# Patient Record
Sex: Male | Born: 1945 | Race: Black or African American | Hispanic: No | Marital: Married | Smoking: Never smoker
Health system: Southern US, Community
[De-identification: ages and names within clinical notes are randomized; demographics above are authoritative.]

## PROBLEM LIST (undated history)

## (undated) DIAGNOSIS — J302 Other seasonal allergic rhinitis: Secondary | ICD-10-CM

## (undated) DIAGNOSIS — I10 Essential (primary) hypertension: Secondary | ICD-10-CM

## (undated) DIAGNOSIS — J45909 Unspecified asthma, uncomplicated: Secondary | ICD-10-CM

## (undated) HISTORY — DX: Essential (primary) hypertension: I10

## (undated) HISTORY — DX: Other seasonal allergic rhinitis: J30.2

## (undated) HISTORY — DX: Unspecified asthma, uncomplicated: J45.909

---

## 2004-03-02 ENCOUNTER — Emergency Department (HOSPITAL_COMMUNITY): Admission: AD | Admit: 2004-03-02 | Discharge: 2004-03-02 | Payer: Self-pay | Admitting: Family Medicine

## 2013-11-07 ENCOUNTER — Ambulatory Visit: Payer: BC Managed Care – PPO

## 2013-11-07 ENCOUNTER — Ambulatory Visit (INDEPENDENT_AMBULATORY_CARE_PROVIDER_SITE_OTHER): Payer: BC Managed Care – PPO | Admitting: Internal Medicine

## 2013-11-07 VITALS — BP 110/70 | HR 98 | Temp 98.6°F | Resp 18 | Ht 63.25 in | Wt 216.0 lb

## 2013-11-07 DIAGNOSIS — R05 Cough: Secondary | ICD-10-CM

## 2013-11-07 DIAGNOSIS — I10 Essential (primary) hypertension: Secondary | ICD-10-CM | POA: Insufficient documentation

## 2013-11-07 DIAGNOSIS — J45909 Unspecified asthma, uncomplicated: Secondary | ICD-10-CM

## 2013-11-07 DIAGNOSIS — J209 Acute bronchitis, unspecified: Secondary | ICD-10-CM

## 2013-11-07 DIAGNOSIS — N400 Enlarged prostate without lower urinary tract symptoms: Secondary | ICD-10-CM | POA: Insufficient documentation

## 2013-11-07 LAB — POCT CBC
Granulocyte percent: 70.8 %G (ref 37–80)
HCT, POC: 39.7 % — AB (ref 43.5–53.7)
Hemoglobin: 11.8 g/dL — AB (ref 14.1–18.1)
Lymph, poc: 2.1 (ref 0.6–3.4)
MCH, POC: 25.7 pg — AB (ref 27–31.2)
MCV: 86.4 fL (ref 80–97)
POC Granulocyte: 7.1 — AB (ref 2–6.9)
POC LYMPH PERCENT: 21.5 %L (ref 10–50)
POC MID %: 7.7 %M (ref 0–12)
RDW, POC: 15 %

## 2013-11-07 MED ORDER — HYDROCODONE-HOMATROPINE 5-1.5 MG/5ML PO SYRP: 5.0000 mL | ORAL_SOLUTION | Freq: Three times a day (TID) | ORAL | Status: DC | PRN

## 2013-11-07 MED ORDER — PREDNISONE 10 MG PO TABS: ORAL_TABLET | ORAL | Status: AC

## 2013-11-07 MED ORDER — BUDESONIDE-FORMOTEROL FUMARATE 160-4.5 MCG/ACT IN AERO: 2.0000 | INHALATION_SPRAY | Freq: Two times a day (BID) | RESPIRATORY_TRACT | Status: DC

## 2013-11-07 MED ORDER — AZITHROMYCIN 250 MG PO TABS: ORAL_TABLET | ORAL | Status: AC

## 2013-11-07 MED ORDER — ALBUTEROL SULFATE (2.5 MG/3ML) 0.083% IN NEBU: 2.5000 mg | INHALATION_SOLUTION | Freq: Once | RESPIRATORY_TRACT | Status: AC

## 2013-11-07 MED ORDER — MUCINEX DM MAXIMUM STRENGTH 60-1200 MG PO TB12: 1.0000 | ORAL_TABLET | Freq: Two times a day (BID) | ORAL | Status: DC

## 2013-11-07 MED ORDER — IPRATROPIUM BROMIDE 0.02 % IN SOLN: 0.5000 mg | Freq: Once | RESPIRATORY_TRACT | Status: DC

## 2013-11-07 MED ORDER — ALBUTEROL SULFATE (2.5 MG/3ML) 0.083% IN NEBU: 2.5000 mg | INHALATION_SOLUTION | Freq: Once | RESPIRATORY_TRACT | Status: DC

## 2013-11-07 NOTE — Progress Notes (Signed)
Subjective:    Patient ID: Bryan Fitzgerald, male    DOB: January 27, 1946, 67 y.o.   MRN: [id number]  HPI  Pt presents to clinic with 1 wk h/o cold symptoms that have continued to progress - he is having trouble sleeping at night due to his cough - he cannot get a full breath esp at night due to the cough.  He is coughing up brown/yellow sputum.  He has h/o asthma and is using his inhaler and still getting some relief.  He last used his inhaler last pm.  He has some nasal congestion and same colored rhinorrhea.  He has been using tylenol for some of his symptoms.    Review of Systems  Constitutional: Negative for fever and chills.  HENT: Positive for congestion, postnasal drip and rhinorrhea. Negative for ear pain and sore throat.   Respiratory: Positive for cough and shortness of breath. Negative for wheezing.   Musculoskeletal: Positive for myalgias.       Objective:   Physical Exam  Vitals reviewed. Constitutional: He is oriented to person, place, and time. He appears well-developed and well-nourished.  HENT:  Head: Normocephalic and atraumatic.  Right Ear: Hearing, tympanic membrane, external ear and ear canal normal.  Left Ear: Hearing, tympanic membrane, external ear and ear canal normal.  Nose: Mucosal edema (red) present.  Mouth/Throat: Uvula is midline, oropharynx is clear and moist and mucous membranes are normal.  Eyes: Conjunctivae are normal.  Cardiovascular: Normal rate, regular rhythm and normal heart sounds.   No murmur heard. Pulmonary/Chest: Effort normal. No respiratory distress. He has wheezes (inspiratory and expiratory).  Neurological: He is alert and oriented to person, place, and time.  Skin: Skin is warm and dry.  Psychiatric: He has a normal mood and affect. His behavior is normal. Judgment and thought content normal.   Pt was given an albuterol/atrovent neb -- he states that he feels slightly better - his lung exam showed some mild improvement with expiratory  wheezing and the inspiratory wheezing was almost resolved.  Pt was given a 2nd albuterol only neb and the expiratory wheezing improved a little more.  Pt was able to speak in full sentences and the cough had almost resolved.  [facility] reading (PRIMARY) by  Dr. Merla Riches.  Bilateral increased markings.  Results for orders placed in visit on 11/07/13  POCT CBC      Result Value Range   WBC 10.0  4.6 - 10.2 K/uL   Lymph, poc 2.1  0.6 - 3.4   POC LYMPH PERCENT 21.5  10 - 50 %L   MID (cbc) 0.8  0 - 0.9   POC MID % 7.7  0 - 12 %M   POC Granulocyte 7.1 (*) 2 - 6.9   Granulocyte percent 70.8  37 - 80 %G   RBC 4.59 (*) 4.69 - 6.13 M/uL   Hemoglobin 11.8 (*) 14.1 - 18.1 g/dL   HCT, POC 11.9 (*) 14.7 - 53.7 %   MCV 86.4  80 - 97 fL   MCH, POC 25.7 (*) 27 - 31.2 pg   MCHC 29.7 (*) 31.8 - 35.4 g/dL   RDW, POC 82.9     Platelet Count, POC 228  142 - 424 K/uL   MPV 8.9  0 - 99.8 fL       Assessment & Plan:  Asthma, chronic - Plan: ipratropium (ATROVENT) nebulizer solution 0.5 mg, albuterol (PROVENTIL) (2.5 MG/3ML) 0.083% nebulizer solution 2.5 mg, albuterol (PROVENTIL) (2.5 MG/3ML) 0.083% nebulizer  solution 2.5 mg, predniSONE (DELTASONE) 10 MG tablet, budesonide-formoterol (SYMBICORT) 160-4.5 MCG/ACT inhaler  Cough - Plan: DG Chest 2 View, POCT CBC, HYDROcodone-homatropine (HYCODAN) 5-1.5 MG/5ML syrup  Acute bronchitis - Plan: azithromycin (ZITHROMAX Z-PAK) 250 MG tablet, Dextromethorphan-Guaifenesin (MUCINEX DM MAXIMUM STRENGTH) 60-1200 MG TB12  Pt will restart his symbicort.  He will push fluids and if he is no better in 48h he will RTC - that will give him 2 days on prednisone and abx.  He will rest today and RTW tomorrow.  Benny Lennert PA-C 11/07/2013 3:06 PM  I have reviewed and agree with documentation. Robert P. Merla Riches, M.D.

## 2013-11-07 NOTE — Patient Instructions (Signed)
Mucinex when you get sick to help with the congestion to help prevent mucus from building up.  Tylenol and motrin to help with myalgias.

## 2013-11-10 ENCOUNTER — Telehealth: Payer: Self-pay

## 2013-11-10 NOTE — Telephone Encounter (Signed)
Pt states was seen by sarah on 11/18 and was given an out of work note for 11/18 & 11/19, pt is requesting a work note to cover 11/18 through 11/21 Please call pt to advise

## 2013-11-10 NOTE — Telephone Encounter (Signed)
Can we give him a work note to cover these dates? Please advise.

## 2013-11-11 ENCOUNTER — Encounter: Payer: Self-pay | Admitting: *Deleted

## 2013-11-11 NOTE — Telephone Encounter (Signed)
Note given to patient to cover dates

## 2014-10-12 ENCOUNTER — Other Ambulatory Visit: Payer: Self-pay | Admitting: Occupational Medicine

## 2014-10-12 ENCOUNTER — Ambulatory Visit: Payer: Self-pay

## 2014-10-12 DIAGNOSIS — R0602 Shortness of breath: Secondary | ICD-10-CM

## 2014-10-29 ENCOUNTER — Ambulatory Visit: Payer: Self-pay

## 2015-10-21 ENCOUNTER — Telehealth: Payer: Self-pay | Admitting: Internal Medicine

## 2015-10-21 NOTE — Telephone Encounter (Signed)
Per  MR- pt will need first available consult appt.  LMTCB for pt.

## 2015-10-24 NOTE — Telephone Encounter (Signed)
Left message for pt to call back  °

## 2015-10-25 NOTE — Telephone Encounter (Signed)
LMOMTCB x3 for pt 

## 2015-10-30 NOTE — Telephone Encounter (Signed)
lmtcb X4 for pt.  

## 2015-11-01 NOTE — Telephone Encounter (Signed)
lmtcb X5 for pt.  Letter sent to pt's home address on file.  Will close message per triage protocol.

## 2015-11-06 NOTE — Telephone Encounter (Signed)
Will forward to MR as FYI 

## 2016-01-01 ENCOUNTER — Telehealth: Payer: Self-pay | Admitting: Internal Medicine

## 2016-01-01 NOTE — Telephone Encounter (Signed)
Pt referred by Occupational Health. Pt was contacted numerous times to schedule appt but unable to reach pt. A letter was sent to pt's mailing address to contact us back to reschedule.   Will forward to MR as FYI.

## 2016-01-02 NOTE — Telephone Encounter (Signed)
Thanks, I have notified Dr Durene CalHunter the referring doc

## 2016-04-23 ENCOUNTER — Encounter: Payer: Self-pay | Admitting: Internal Medicine

## 2016-04-23 ENCOUNTER — Ambulatory Visit (INDEPENDENT_AMBULATORY_CARE_PROVIDER_SITE_OTHER): Payer: BLUE CROSS/BLUE SHIELD | Admitting: Internal Medicine

## 2016-04-23 VITALS — BP 158/80 | HR 107 | Ht 63.0 in | Wt 228.4 lb

## 2016-04-23 DIAGNOSIS — Z5739 Occupational exposure to other air contaminants: Secondary | ICD-10-CM | POA: Diagnosis not present

## 2016-04-23 DIAGNOSIS — Z8709 Personal history of other diseases of the respiratory system: Secondary | ICD-10-CM

## 2016-04-23 NOTE — Patient Instructions (Signed)
ICD-9-CM ICD-10-CM   1. History of chronic obstructive airway disease V12.69 Z87.09   2. Occupational exposure to air contaminants V15.89 Z57.39     Do full PFT Do FeNO testing  Followup - return to see me after PFT testing

## 2016-04-23 NOTE — Progress Notes (Signed)
Subjective:    Patient ID: Bryan Fitzgerald, male    DOB: September 27, 1946, 70 y.o.   MRN: [id number]  PCP Eino Farber, MD   HPI  IOV 04/23/2016  Chief Complaint  Patient presents with  . Pulmonary Consult    Pt states he is referred by Dr. Durene Cal. Pt denies significant SOB, cough, CP/tightness. Pt states he only wheezes when he has a cold or exposed to seasonal allergies.     [name]  70 year old gentleman who works at [name] where there is exposure to flavoring agents particularly diacetyl. He was originally referred end of September 2016 by Dr. Kathrine Haddock from occupational medicine. However it was hard to get hold of him and he now presents for evaluation, he is not fully sure why he is here today. He thinks this is because he wants to be released back to work. I do have Dr. Grayland Ormond Hunt's office note from 09/16/2015. The history is based on this note and talking to the patient was not exactly a good historian  He has worked at [name] for 17 years according to his history. 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 He has enjoyed working there. He does not want to quit. He tells me that he's never had any respiratory problem. He says that he uses a fit bed to monitor his steps and he easily crosses 10,000 steps daily. This is despite his significant obesity with a BMI of 40. He does not have any dyspnea on exertion. He does not have cough. He does not have wheeze. The only time he would wheezes when he has an occasional cold. However he is on Symbicort maintenance for the last 3 years according to him. This was prescribed by his primary care physician per history. He says is compliant with it. He is also on Singulair. For the same one time. He does not use pro-air because he does not require. He has a history of asthma but he does not know when this diagnosis was made on how it was made. He denies history of  exacerbations. Never smoked. No hospitalizations for asthma. No steroid intake for asthma.   In terms of occupational lung testing he says that he started doing PFTs/spirometry 3 years ago and he passed it at that point in time. He passed it again a year ago. This was at an outside [facility]. He thinks I am to do the next one now. However referral notes from Dr. Kathrine Haddock indicated that he has severe obstruction details of which are not fully known.  Exhaled nitric oxide testing today f is 25 ppb and normal. This is done while he is on Symbicort   Last chest x-Pedrosa 2015 personally visualized and is clear except for hyperinflation    Symptom score was done because he is a poor historian for documentation purposes and indeed his total score is 7 showing very minimal symptomatology..,   CAT sympotpm socre 04/23/2016   Never Cough -> Cough all the time 2  No phlegm in chest -> Chest is full of phlegm 2  No chest tightness -> Chest feels very tight 0  No dyspnea for 1 flight stairs/hill -> Very dyspneic for 1 flight of stairs 2  No limitations for ADL at home -> Very limited with ADL at home 0  Confident leaving home -> Not at all confident leaving  home 0  Sleep soundly -> Do not sleep soundly because of lung condition 1  Lots of Energy -> No energy at all 0  TOTAL Score (max 40)  7        has a past medical history of Asthma; Hypertension; and Seasonal allergies.   reports that he has never smoked. He has never used smokeless tobacco.  No past surgical history on file.  No Known Allergies   There is no immunization history on file for this patient.  Family History  Problem Relation Age of Onset  . Heart attack Mother      Current outpatient prescriptions:  .  albuterol (PROVENTIL HFA;VENTOLIN HFA) 108 (90 BASE) MCG/ACT inhaler, Inhale into the lungs every 6 (six) hours as needed for wheezing or shortness of breath., Disp: , Rfl:  .  aspirin  81 MG tablet, Take 81 mg by mouth daily., Disp: , Rfl:  .  budesonide-formoterol (SYMBICORT) 160-4.5 MCG/ACT inhaler, Inhale 2 puffs into the lungs 2 (two) times daily., Disp: 1 Inhaler, Rfl: 1 .  DOXAZOSIN MESYLATE PO, Take by mouth., Disp: , Rfl:  .  finasteride (PROSCAR) 5 MG tablet, Take 5 mg by mouth daily., Disp: , Rfl:  .  LOSARTAN POTASSIUM-HCTZ PO, Take by mouth., Disp: , Rfl:  .  montelukast (SINGULAIR) 10 MG tablet, Take 10 mg by mouth at bedtime., Disp: , Rfl:   Current facility-administered medications:  .  albuterol (PROVENTIL) (2.5 MG/3ML) 0.083% nebulizer solution 2.5 mg, 2.5 mg, Nebulization, Once, Morrell RiddleSarah L Weber, PA-C .  albuterol (PROVENTIL) (2.5 MG/3ML) 0.083% nebulizer solution 2.5 mg, 2.5 mg, Nebulization, Once, Morrell RiddleSarah L Weber, PA-C .  ipratropium (ATROVENT) nebulizer solution 0.5 mg, 0.5 mg, Nebulization, Once, Morrell RiddleSarah L Weber, PA-C     Review of Systems  Constitutional: Negative for fever and unexpected weight change.  HENT: Negative for congestion, dental problem, ear pain, nosebleeds, postnasal drip, rhinorrhea, sinus pressure, sneezing, sore throat and trouble swallowing.   Eyes: Negative for redness and itching.  Respiratory: Negative for cough, chest tightness, shortness of breath and wheezing.   Cardiovascular: Negative for palpitations and leg swelling.  Gastrointestinal: Negative for nausea and vomiting.  Genitourinary: Negative for dysuria.  Musculoskeletal: Negative for joint swelling.  Skin: Negative for rash.  Neurological: Negative for headaches.  Hematological: Does not bruise/bleed easily.  Psychiatric/Behavioral: Negative for dysphoric mood. The patient is not nervous/anxious.        Objective:   Physical Exam  Constitutional: He is oriented to person, place, and time. He appears well-developed and well-nourished. No distress.  Body mass index is 40.47 kg/(m^2).  His clothes smell of flavoring agents - classic [employer] employee smell     HENT:  Head: Normocephalic and atraumatic.  Right Ear: External ear normal.  Left Ear: External ear normal.  Mouth/Throat: Oropharynx is clear and moist. No oropharyngeal exudate.  Eyes: Conjunctivae and EOM are normal. Pupils are equal, round, and reactive to light. Right eye exhibits no discharge. Left eye exhibits no discharge. No scleral icterus.  Neck: Normal range of motion. Neck supple. No JVD present. No tracheal deviation present. No thyromegaly present.  Cardiovascular: Normal rate, regular rhythm and intact distal pulses.  Exam reveals no gallop and no friction rub.   No murmur heard. Pulmonary/Chest: Effort normal and breath sounds normal. No respiratory distress. He has no wheezes. He has no rales. He exhibits no tenderness.  Abdominal: Soft. Bowel sounds are normal. He exhibits no distension and no mass. There is no tenderness.  There is no rebound and no guarding.  Musculoskeletal: Normal range of motion. He exhibits no edema or tenderness.  Lymphadenopathy:    He has no cervical adenopathy.  Neurological: He is alert and oriented to person, place, and time. He has normal reflexes. No cranial nerve deficit. Coordination normal.  Skin: Skin is warm and dry. No rash noted. He is not diaphoretic. No erythema. No pallor.  Psychiatric: He has a normal mood and affect. His behavior is normal. Judgment and thought content normal.  Nursing note and vitals reviewed.   Filed Vitals:   04/23/16 1444  BP: 158/80  Pulse: 107  Height: 5\' 3"  (1.6 m)  Weight: 228 lb 6.4 oz (103.602 kg)  SpO2: 96%         Assessment & Plan:     ICD-9-CM ICD-10-CM   1. History of chronic obstructive airway disease V12.69 Z87.09 Pulmonary function test  2. Occupational exposure to air contaminants V15.89 Z57.39 Pulmonary function test    He has occupational exposure history of flavoring agents particularly diacetyl. His clothes smell of flavoring agents. He is minimally symptomatic and is feeling  well. There is a history of severe obstruction and I do not have these pulmonary function tests with me. The plan right now is to first establish that he has obstructive lung disease. If he does he will be subject to a CT scan of the chest we will also make some determination about looking at his old spirometry done outside   Dr. Kalman Shan, M.D., [facility].C.P Pulmonary and Critical Care Medicine Staff Physician [facility] Pager: [phone], If no answer or between  15:00h - 7:00h: call [phone]  04/23/2016 3:24 PM

## 2016-04-24 ENCOUNTER — Institutional Professional Consult (permissible substitution): Payer: Self-pay | Admitting: Internal Medicine

## 2016-06-12 ENCOUNTER — Ambulatory Visit (INDEPENDENT_AMBULATORY_CARE_PROVIDER_SITE_OTHER): Payer: BLUE CROSS/BLUE SHIELD | Admitting: Internal Medicine

## 2016-06-12 DIAGNOSIS — Z8709 Personal history of other diseases of the respiratory system: Secondary | ICD-10-CM

## 2016-06-12 DIAGNOSIS — J449 Chronic obstructive pulmonary disease, unspecified: Secondary | ICD-10-CM

## 2016-06-12 DIAGNOSIS — Z5739 Occupational exposure to other air contaminants: Secondary | ICD-10-CM

## 2016-06-12 LAB — PULMONARY FUNCTION TEST
DL/VA % pred: 114 %
DL/VA: 4.79 ml/min/mmHg/L
DLCO UNC: 21.34 ml/min/mmHg
DLCO cor % pred: 88 %
DLCO cor: 21.53 ml/min/mmHg
DLCO unc % pred: 87 %
FEF 25-75 PRE: 0.42 L/s
FEF 25-75 Post: 0.38 L/sec
FEF2575-%Change-Post: -11 %
FEF2575-%PRED-POST: 18 %
FEF2575-%Pred-Pre: 20 %
FEV1-%CHANGE-POST: -3 %
FEV1-%PRED-PRE: 49 %
FEV1-%Pred-Post: 48 %
FEV1-PRE: 1.12 L
FEV1-Post: 1.09 L
FEV1FVC-%Change-Post: 2 %
FEV1FVC-%Pred-Pre: 65 %
FEV6-%CHANGE-POST: -4 %
FEV6-%PRED-POST: 70 %
FEV6-%Pred-Pre: 73 %
FEV6-Post: 2 L
FEV6-Pre: 2.1 L
FEV6FVC-%CHANGE-POST: 1 %
FEV6FVC-%PRED-POST: 100 %
FEV6FVC-%PRED-PRE: 98 %
FVC-%CHANGE-POST: -5 %
FVC-%Pred-Post: 70 %
FVC-%Pred-Pre: 74 %
FVC-Post: 2.11 L
FVC-Pre: 2.23 L
POST FEV1/FVC RATIO: 51 %
PRE FEV6/FVC RATIO: 94 %
Post FEV6/FVC ratio: 95 %
Pre FEV1/FVC ratio: 50 %
RV % PRED: 166 %
RV: 3.46 L
TLC % pred: 99 %
TLC: 5.75 L

## 2016-06-12 NOTE — Progress Notes (Signed)
Patient ID: Roger ShelterWalter L Doublin, male   DOB: 1946/10/09, 70 y.o.   MRN: [id number]   PFT done today.Vivianne SpenceKatie Blessing Ozga,CMA

## 2016-06-26 ENCOUNTER — Ambulatory Visit (INDEPENDENT_AMBULATORY_CARE_PROVIDER_SITE_OTHER): Payer: BLUE CROSS/BLUE SHIELD | Admitting: Internal Medicine

## 2016-06-26 ENCOUNTER — Encounter: Payer: Self-pay | Admitting: Internal Medicine

## 2016-06-26 ENCOUNTER — Other Ambulatory Visit (INDEPENDENT_AMBULATORY_CARE_PROVIDER_SITE_OTHER): Payer: BLUE CROSS/BLUE SHIELD

## 2016-06-26 VITALS — BP 118/72 | HR 97 | Ht 64.0 in | Wt 230.0 lb

## 2016-06-26 DIAGNOSIS — J449 Chronic obstructive pulmonary disease, unspecified: Secondary | ICD-10-CM

## 2016-06-26 DIAGNOSIS — Z5739 Occupational exposure to other air contaminants: Secondary | ICD-10-CM | POA: Diagnosis not present

## 2016-06-26 LAB — CBC WITH DIFFERENTIAL/PLATELET
Basophils Absolute: 0 K/uL (ref 0.0–0.1)
Basophils Relative: 0.5 % (ref 0.0–3.0)
Eosinophils Absolute: 0.5 K/uL (ref 0.0–0.7)
Eosinophils Relative: 6 % — ABNORMAL HIGH (ref 0.0–5.0)
HCT: 35.6 % — ABNORMAL LOW (ref 39.0–52.0)
Hemoglobin: 11.3 g/dL — ABNORMAL LOW (ref 13.0–17.0)
Lymphocytes Relative: 12.5 % (ref 12.0–46.0)
Lymphs Abs: 1.1 K/uL (ref 0.7–4.0)
MCHC: 31.8 g/dL (ref 30.0–36.0)
MCV: 82.6 fl (ref 78.0–100.0)
Monocytes Absolute: 0.7 K/uL (ref 0.1–1.0)
Monocytes Relative: 7.6 % (ref 3.0–12.0)
Neutro Abs: 6.6 K/uL (ref 1.4–7.7)
Neutrophils Relative %: 73.4 % (ref 43.0–77.0)
Platelets: 235 K/uL (ref 150.0–400.0)
RBC: 4.31 Mil/uL (ref 4.22–5.81)
RDW: 16.3 % — ABNORMAL HIGH (ref 11.5–15.5)
WBC: 9 K/uL (ref 4.0–10.5)

## 2016-06-26 MED ORDER — FLUTICASONE FUROATE-VILANTEROL 200-25 MCG/INH IN AEPB: 1.0000 | INHALATION_SPRAY | Freq: Every day | RESPIRATORY_TRACT | Status: DC

## 2016-06-26 NOTE — Addendum Note (Signed)
Addended by: Maisie FusGREEN, Ehren Berisha M on: 06/26/2016 03:18 PM   Modules accepted: Orders

## 2016-06-26 NOTE — Patient Instructions (Addendum)
ICD-9-CM ICD-10-CM   1. Chronic obstructive pulmonary disease, unspecified COPD type (HCC) 496 J44.9   2. Occupational exposure to air contaminants V15.89 Z57.39     Possible that you have obstructive lung disease from exposure to chemicals at work  - ; I am unable to figure out other reason but will look for all possible causes  - Please sign release for results of old breathing tests from [employer] and Dr Petra KubaKilpatrick  - Need to rule out allergies and other causes of obstructive lung disease   - do cbc with diff, IgE, blood allergy panel  - - do HRCT chest  Start Breo high dose daily Use albuterol as needed Avoid lifting heavy objects and exposure to chemicals  Followup - Spirometry with BD testing (no lung vol, no dlco) at pft lab in 2 months - return to see me in 2 months or sooner if needd

## 2016-06-26 NOTE — Progress Notes (Signed)
Subjective:    Patient ID: Bryan Fitzgerald, male    DOB: August 12, 1946, 70 y.o.   MRN: [id number]  HPI  PCP Eino FarberKILPATRICK JR,GEORGE R, MD   HPI  IOV 04/23/2016  Chief Complaint  Patient presents with  . Pulmonary Consult    Pt states he is referred by Dr. Durene CalHunter. Pt denies significant SOB, cough, CP/tightness. Pt states he only wheezes when he has a cold or exposed to seasonal allergies.     Bryan Fitzgerald  70 year old gentleman who works at [name] where there is exposure to flavoring agents particularly diacetyl. He was originally referred end of September 2016 by Dr. Kathrine HaddockMary Ruth Hunt from occupational medicine. However it was hard to get hold of him and he now presents for evaluation, he is not fully sure why he is here today. He thinks this is because he wants to be released back to work. I do have Dr. Grayland OrmondMary Ruth Hunt's office note from 09/16/2015. The history is based on this note and talking to the patient was not exactly a good historian  He has worked at [name] for 17 years according to his history. 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 He has enjoyed working there. He does not want to quit. He tells me that he's never had any respiratory problem. He says that he uses a fit bit to monitor his steps and he easily crosses 10,000 steps daily. This is despite his significant obesity with a BMI of 40. He does not have any dyspnea on exertion. He does not have cough. He does not have wheeze. The only time he would wheezes when he has an occasional cold. However he is on Symbicort maintenance for the last 3 years according to him. This was prescribed by his primary care physician per history. He says is compliant with it. He is also on Singulair. For the same one time. He does not use pro-air because he does not require. He has a history of asthma but he does not know when this diagnosis was made on how it was made. He denies  history of exacerbations. Never smoked. No hospitalizations for asthma. No steroid intake for asthma.   In terms of occupational lung testing he says that he started doing PFTs/spirometry 3 years ago and he passed it at that point in time. He passed it again a year ago. This was [facility]. He thinks I am to do the next one now. However referral notes from Dr. Kathrine HaddockMary Ruth Hunt indicated that he has severe obstruction details of which are not fully known.  Exhaled nitric oxide testing today f is 25 ppb and normal. This is done while he is on Symbicort but on 06/26/2016 - he said he was not taking symbicort at this time   Last chest x-Hyser 2015 personally visualized and is clear except for hyperinflation    Symptom score was done because he is a poor historian for documentation purposes and indeed his total score is 7 showing very minimal symptomatology..,    OV 06/26/2016  Chief Complaint  Patient presents with  . Follow-up    Denies any current breathing issues. Review PFT   Here to discuss his test results. He tells me that last visit when we did the nitric oxide testing he was not taking his Symbicort. In fact has not been taking his Symbicort for many months.  He says it does not make any difference for him. He does use his Singulair and he is compliant with it. He again states that he was told by his employer that spirometry testing several years ago was normal and this postdated him starting work at [name]. He does not feel any dyspnea with exertion. He says that he is not exposed to chemicals directly. 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 He can lift 50 pounds without any problems. He does not use is albuterol for rescue he does not wake up in the middle of the night. Although and exam today he did have some wheezing. Pulmonary function test 06/12/2016 shows postbronchodilator FEV1 of 1.09 L/48% with a ratio 51  consistent with obstruction. There is no bronchodilator response. Total lung capacity 5.75 L/99% normal. DLCO is 21.53/88%. This fits in with severe obstruction.   CAT sympotpm socre 04/23/2016   Never Cough -> Cough all the time 2  No phlegm in chest -> Chest is full of phlegm 2  No chest tightness -> Chest feels very tight 0  No dyspnea for 1 flight stairs/hill -> Very dyspneic for 1 flight of stairs 2  No limitations for ADL at home -> Very limited with ADL at home 0  Confident leaving home -> Not at all confident leaving home 0  Sleep soundly -> Do not sleep soundly because of lung condition 1  Lots of Energy -> No energy at all 0  TOTAL Score (max 40)  7       has a past medical history of Asthma; Hypertension; and Seasonal allergies.   reports that he has never smoked. He has never used smokeless tobacco.  No past surgical history on file.  No Known Allergies   There is no immunization history on file for this patient.  Family History  Problem Relation Age of Onset  . Heart attack Mother      Current outpatient prescriptions:  .  albuterol (PROVENTIL HFA;VENTOLIN HFA) 108 (90 BASE) MCG/ACT inhaler, Inhale into the lungs every 6 (six) hours as needed for wheezing or shortness of breath., Disp: , Rfl:  .  aspirin 81 MG tablet, Take 81 mg by mouth daily., Disp: , Rfl:  .  DOXAZOSIN MESYLATE PO, Take by mouth., Disp: , Rfl:  .  finasteride (PROSCAR) 5 MG tablet, Take 5 mg by mouth daily., Disp: , Rfl:  .  LOSARTAN POTASSIUM-HCTZ PO, Take by mouth., Disp: , Rfl:  .  montelukast (SINGULAIR) 10 MG tablet, Take 10 mg by mouth at bedtime., Disp: , Rfl:   Current facility-administered medications:  .  albuterol (PROVENTIL) (2.5 MG/3ML) 0.083% nebulizer solution 2.5 mg, 2.5 mg, Nebulization, Once, Sarah L Weber, PA-C .  albuterol (PROVENTIL) (2.5 MG/3ML) 0.083% nebulizer solution 2.5 mg, 2.5 mg, Nebulization, Once, Morrell RiddleSarah L Weber, PA-C .  ipratropium (ATROVENT) nebulizer  solution 0.5 mg, 0.5 mg, Nebulization, Once, Morrell RiddleSarah L Weber, PA-C     Review of Systems     Objective:   Physical Exam  Filed Vitals:   06/26/16 1446  BP: 118/72  Pulse: 97  Height: 5\' 4"  (1.626 m)  Weight: 230 lb (104.327 kg)  SpO2: 95%   Obese male sitting comfortably. Focused exam shows wheezing bilaterally in the bases.      Assessment & Plan:     ICD-9-CM ICD-10-CM   1. Chronic obstructive pulmonary disease, unspecified COPD type (HCC) 496 J44.9   2. Occupational exposure to air contaminants V15.89  Z57.39     Possible that you have obstructive lung disease from exposure to chemicals at work  - ; I am unable to figure out other reason but will look for all possible causes  - Please sign release for results of old breathing tests from [employer] and Dr Petra Kuba  - Need to rule out allergies and other causes of obstructive lung disease   - do cbc with diff, IgE, blood allergy panel  - - do HRCT chest  Start Breo high dose daily Use albuterol as needed Avoid lifting heavy objects and exposure to chemicals Continue singulair  Followup - Spirometry with BD testing (no lung vol, no dlco) at pft lab in 2 months - to see response to breo - consider spiriva at [facility] if no response - return to see me in 2 months or sooner if needd     > 50% of this > 25 min visit spent in face to face counseling or coordination of care    Dr. Kalman Shan, M.D., [facility].C.P Pulmonary and Critical Care Medicine Staff Physician [facility] Pager: [phone], If no answer or between  15:00h - 7:00h: call [phone]  06/26/2016 3:10 PM

## 2016-06-29 LAB — RESPIRATORY ALLERGY PROFILE REGION II ~~LOC~~
ALTERNARIA ALTERNATA: 1.36 kU/L — AB
ASPERGILLUS FUMIGATUS M3: 0.71 kU/L — AB
Allergen, Cedar tree, t12: 0.52 kU/L — ABNORMAL HIGH
Allergen, Comm Silver Birch, t9: 0.57 kU/L — ABNORMAL HIGH
Allergen, Cottonwood, t14: 0.66 kU/L — ABNORMAL HIGH
Allergen, Mouse Urine Protein, e78: <0.1 kU/L
Allergen, Mulberry, t76: 0.3 kU/L — ABNORMAL HIGH
Allergen, Oak,t7: 0.58 kU/L — ABNORMAL HIGH
Bermuda Grass: 0.43 kU/L — ABNORMAL HIGH
Box Elder IgE: 0.9 kU/L — ABNORMAL HIGH
COMMON RAGWEED: 0.43 kU/L — AB
Cat Dander: 1.94 kU/L — ABNORMAL HIGH
Cladosporium Herbarum: 0.83 kU/L — ABNORMAL HIGH
Cockroach: <0.1 kU/L
D. FARINAE: 0.23 kU/L — AB
Dog Dander: 0.34 kU/L — ABNORMAL HIGH
Elm IgE: 0.72 kU/L — ABNORMAL HIGH
IgE (Immunoglobulin E), Serum: 377 kU/L — ABNORMAL HIGH (ref <115)
JOHNSON GRASS: 0.47 kU/L — AB
PECAN/HICKORY TREE IGE: 2 kU/L — AB
PENICILLIUM NOTATUM: 0.36 kU/L — AB
ROUGH PIGWEED IGE: 0.44 kU/L — AB
Sheep Sorrel IgE: 0.37 kU/L — ABNORMAL HIGH
TIMOTHY GRASS: 4.13 kU/L — AB

## 2016-07-02 ENCOUNTER — Telehealth: Payer: Self-pay | Admitting: Internal Medicine

## 2016-07-02 NOTE — Telephone Encounter (Signed)
Pls let him know a) ok I respec thim canceling ct; b) allergy test positive - will discuss at [facility]

## 2016-07-02 NOTE — Telephone Encounter (Signed)
Pt cancelled CT chest, stating he did not want to have it.   Forwarding to MR as FYI.

## 2016-07-03 ENCOUNTER — Inpatient Hospital Stay: Admission: RE | Admit: 2016-07-03 | Payer: Self-pay | Source: Ambulatory Visit

## 2016-07-03 NOTE — Telephone Encounter (Signed)
Called and lm with family to have pt return our call.

## 2016-07-06 NOTE — Telephone Encounter (Signed)
lmtcb x2 for pt's family.

## 2016-07-07 NOTE — Telephone Encounter (Signed)
Another message has been left with the pt's family.

## 2016-07-08 NOTE — Telephone Encounter (Signed)
We have attempted to contact the pt several times with no success or response from the pt or his family. Message will be closed per triage protocol.

## 2016-09-04 ENCOUNTER — Ambulatory Visit: Payer: Self-pay | Admitting: Internal Medicine

## 2016-10-19 ENCOUNTER — Telehealth: Payer: Self-pay | Admitting: Internal Medicine

## 2016-10-19 NOTE — Telephone Encounter (Signed)
lmtcb X1 for Melissa.  

## 2016-10-22 NOTE — Telephone Encounter (Signed)
lmomtcb x 2  

## 2016-10-27 NOTE — Telephone Encounter (Signed)
lmtcb X3 for Bryan Fitzgerald.  Will close encounter per triage protocol.

## 2017-05-20 ENCOUNTER — Encounter: Payer: Self-pay | Admitting: Podiatry

## 2017-05-20 ENCOUNTER — Ambulatory Visit: Payer: Self-pay | Admitting: Podiatry

## 2017-05-20 ENCOUNTER — Ambulatory Visit (INDEPENDENT_AMBULATORY_CARE_PROVIDER_SITE_OTHER): Payer: Managed Care, Other (non HMO) | Admitting: Podiatry

## 2017-05-20 VITALS — Ht 63.0 in | Wt 220.0 lb

## 2017-05-20 DIAGNOSIS — M2041 Other hammer toe(s) (acquired), right foot: Secondary | ICD-10-CM | POA: Diagnosis not present

## 2017-05-20 DIAGNOSIS — M21619 Bunion of unspecified foot: Secondary | ICD-10-CM | POA: Diagnosis not present

## 2017-05-20 DIAGNOSIS — M79672 Pain in left foot: Secondary | ICD-10-CM | POA: Diagnosis not present

## 2017-05-20 DIAGNOSIS — M79671 Pain in right foot: Secondary | ICD-10-CM | POA: Diagnosis not present

## 2017-05-20 DIAGNOSIS — B351 Tinea unguium: Secondary | ICD-10-CM | POA: Diagnosis not present

## 2017-05-20 DIAGNOSIS — M2042 Other hammer toe(s) (acquired), left foot: Secondary | ICD-10-CM

## 2017-05-20 NOTE — Progress Notes (Signed)
SUBJECTIVE: 71 y.o. year old male presents complaining of painful corns and calluses under the tip of toe and ball of feet. Also having problem with dry skin. Feet aches to stand on. He is still employed full time wearing steel toed shoes.  REVIEW OF SYSTEMS: A comprehensive review of systems was negative except for: Asthma, Hypertension and takes medication for Prostate.  OBJECTIVE: DERMATOLOGIC EXAMINATION: Nails: Thick dystrophic nails x 10. Thick fungal debri Digital corn distal end 2nd toe bilateral. Plantar callus under great toe bilateral.  VASCULAR EXAMINATION OF LOWER LIMBS: All pedal pulses are palpable with normal pulsation.  Capillary Filling times within 3 seconds in all digits.  No edema or erythema noted. Temperature gradient from tibial crest to dorsum of foot is within normal bilateral.  NEUROLOGIC EXAMINATION OF THE LOWER LIMBS: Achilles DTR is present and within normal. Monofilament (Semmes-Weinstein 10-gm) sensory testing positive 6 out of 6, bilateral. Vibratory sensations(128Hz  turning fork) intact at medial and lateral forefoot bilateral.  Sharp and Dull discriminatory sensations at the plantar ball of hallux is intact bilateral.   MUSCULOSKELETAL EXAMINATION: Positive for HAV with bunion right.  Flat medial longitudinal arch bilateral. Contracted 2nd digits bilateral.   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 Painful digital corns 2nd digits bilateral. Painful onychomycosis x 10, severe on both great toes.  PLAN: Reviewed clinical findings and available treatment options. All nails and skin lesions debrided. Reviewed benefit of custom orthotics and proper shoe gear. Return in 3 months or sooner if needed.

## 2017-05-20 NOTE — Patient Instructions (Addendum)
Seen for painful corns calluses and nails. All debrided. May benefit from Vitamin A cream for dry skin condition.  May benefit from custom orthotics. Return in 3 month.

## 2017-08-19 ENCOUNTER — Encounter: Payer: Self-pay | Admitting: Podiatry

## 2017-08-19 ENCOUNTER — Ambulatory Visit (INDEPENDENT_AMBULATORY_CARE_PROVIDER_SITE_OTHER): Payer: Managed Care, Other (non HMO) | Admitting: Podiatry

## 2017-08-19 DIAGNOSIS — M79672 Pain in left foot: Secondary | ICD-10-CM

## 2017-08-19 DIAGNOSIS — M79671 Pain in right foot: Secondary | ICD-10-CM

## 2017-08-19 DIAGNOSIS — B351 Tinea unguium: Secondary | ICD-10-CM

## 2017-08-19 NOTE — Patient Instructions (Signed)
Seen for hypertrophic nails. All nails debrided. May soak in Vinegar water 15 minutes for itch feet. Return in 3 months or as needed.

## 2017-08-19 NOTE — Progress Notes (Signed)
SUBJECTIVE: 71 y.o. year old male presents complaining of itch skin and thick toe nails. Also having problem with dry skin.  Feet aches to stand on. He is still employed full time wearing steel toed shoes.  OBJECTIVE: DERMATOLOGIC EXAMINATION: Thick dystrophic nails x 10. Thick fungal debri Digital corn distal end 2nd toe bilateral. Plantar callus under great toe bilateral.  VASCULAR EXAMINATION OF LOWER LIMBS: All pedal pulses are palpable with normal pulsation.  Capillary Filling times within 3 seconds in all digits.  No edema or erythema noted. Temperature gradient from tibial crest to dorsum of foot is within normal bilateral.  NEUROLOGIC EXAMINATION OF THE LOWER LIMBS: All pedal pulses are palpable. Sharp and Dull discriminatory sensations at the plantar ball of hallux is intact bilateral.   MUSCULOSKELETAL EXAMINATION: Positive for HAV with bunion right.  Flat medial longitudinal arch bilateral. Contracted 2nd digits bilateral.   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 Painful digital corns 2nd digits bilateral. Painful onychomycosis x 10, severe on both great toes.  PLAN: Reviewed clinical findings and available treatment options. All nails and skin lesions debrided. May soak in Vinegar water or Salsun blue shampoo and scrub off dry skin. Return in 3 months or sooner if needed.

## 2017-11-18 ENCOUNTER — Ambulatory Visit: Payer: Managed Care, Other (non HMO) | Admitting: Podiatry

## 2017-11-18 ENCOUNTER — Encounter: Payer: Self-pay | Admitting: Podiatry

## 2017-11-18 DIAGNOSIS — M2041 Other hammer toe(s) (acquired), right foot: Secondary | ICD-10-CM

## 2017-11-18 DIAGNOSIS — B351 Tinea unguium: Secondary | ICD-10-CM

## 2017-11-18 DIAGNOSIS — M2042 Other hammer toe(s) (acquired), left foot: Secondary | ICD-10-CM

## 2017-11-18 DIAGNOSIS — M79672 Pain in left foot: Secondary | ICD-10-CM | POA: Diagnosis not present

## 2017-11-18 DIAGNOSIS — M79671 Pain in right foot: Secondary | ICD-10-CM | POA: Diagnosis not present

## 2017-11-18 NOTE — Patient Instructions (Addendum)
Seen for hypertrophic nails and painful corns. All nails and painful lesions debrided. OTC orthotic dispensed. Return in 3 months or as needed.

## 2017-11-18 NOTE — Progress Notes (Signed)
Subjective: 71 y.o. year old male patient presents complaining of painful nails. 2nd toe left foot hurts and ball of both feet hurts to walk. He was recommended of custom orthotics. Stated that he cannot get custom made but will try over the counter products. 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  Objective: Dermatologic: Thick yellow deformed nails x 10.  Painful digital corn distal end 2nd toe left. Vascular: Pedal pulses are all palpable. Orthopedic: Hallux valgus with bunion deformity bilateral. Pes planus bilateral with pain on balls of both feet. Severe digital contracture with digital corn distal end 2nd L>R. Neurologic: All epicritic and tactile sensations grossly intact.  Assessment: Dystrophic mycotic nails x 10. Painful digital corn 2nd bilateral. Lesser metatarsalgia bilateral. Pes planus bilateral.  Treatment: All mycotic nails and painful corns debrided.  As per request OTC orthotics size 10 dispensed. Return in 3 months or as needed.

## 2018-02-15 ENCOUNTER — Ambulatory Visit: Payer: Managed Care, Other (non HMO) | Admitting: Podiatry

## 2018-03-01 ENCOUNTER — Ambulatory Visit: Payer: Managed Care, Other (non HMO) | Admitting: Podiatry

## 2018-03-01 DIAGNOSIS — M2041 Other hammer toe(s) (acquired), right foot: Secondary | ICD-10-CM | POA: Diagnosis not present

## 2018-03-01 DIAGNOSIS — M2042 Other hammer toe(s) (acquired), left foot: Secondary | ICD-10-CM

## 2018-03-01 DIAGNOSIS — B351 Tinea unguium: Secondary | ICD-10-CM | POA: Diagnosis not present

## 2018-03-01 DIAGNOSIS — M79671 Pain in right foot: Secondary | ICD-10-CM | POA: Diagnosis not present

## 2018-03-01 DIAGNOSIS — M79672 Pain in left foot: Secondary | ICD-10-CM

## 2018-03-01 NOTE — Patient Instructions (Signed)
Seen for hypertrophic nails. All nails debrided. Return in 3 months or as needed.  

## 2018-03-04 ENCOUNTER — Encounter: Payer: Self-pay | Admitting: Podiatry

## 2018-03-04 NOTE — Progress Notes (Signed)
Subjective: 72 y.o. year old male patient presents complaining of painful nails. Patient requests toe nails trimmed. Still working full time and on feet during the day.  Objective: Dermatologic: Thick yellow deformed nails x 10. Vascular: Pedal pulses are all palpable. Orthopedic: Hallux valgus with bunion bilateral. Pes planus bilateral with pain in ball of both feet. Neurologic: All epicritic and tactile sensations grossly intact.  Assessment: Dystrophic mycotic nails x 10. Pain in both feet. Lesser metatarsalgia.  Treatment: All mycotic nails debrided.  Return in 3 months or as needed.

## 2018-06-07 ENCOUNTER — Ambulatory Visit: Payer: Managed Care, Other (non HMO) | Admitting: Podiatry

## 2018-06-14 ENCOUNTER — Ambulatory Visit: Payer: Managed Care, Other (non HMO) | Admitting: Podiatry

## 2018-06-16 ENCOUNTER — Ambulatory Visit: Payer: Managed Care, Other (non HMO) | Admitting: Podiatry

## 2018-06-16 ENCOUNTER — Encounter: Payer: Self-pay | Admitting: Podiatry

## 2018-06-16 DIAGNOSIS — B351 Tinea unguium: Secondary | ICD-10-CM | POA: Diagnosis not present

## 2018-06-16 DIAGNOSIS — M79671 Pain in right foot: Secondary | ICD-10-CM | POA: Diagnosis not present

## 2018-06-16 DIAGNOSIS — L600 Ingrowing nail: Secondary | ICD-10-CM

## 2018-06-16 DIAGNOSIS — M79672 Pain in left foot: Secondary | ICD-10-CM | POA: Diagnosis not present

## 2018-06-16 NOTE — Patient Instructions (Signed)
Seen for hypertrophic nails. All nails debrided. Return in 3 months or as needed.  

## 2018-06-16 NOTE — Progress Notes (Signed)
Subjective: 72 y.o. year old male patient presents complaining of painful nails. Patient requests toe nails trimmed. Still working full time as a Hospital doctordriver.   Objective: Dermatologic: Thick yellow deformed nails x 10. Dystrophic ingrown nails on both big and 2nd toe nails. Vascular: Pedal pulses are all palpable. Orthopedic: Contracted lesser digits bilateral. Neurologic: All epicritic and tactile sensations grossly intact.  Assessment: Dystrophic mycotic nails x 10. Ingrown nails both feet. Painful toes bilateral.  Treatment: All mycotic nails debrided.  Return in 3 months or as needed.

## 2018-09-22 ENCOUNTER — Ambulatory Visit: Payer: Managed Care, Other (non HMO) | Admitting: Podiatry

## 2018-09-22 DIAGNOSIS — L600 Ingrowing nail: Secondary | ICD-10-CM

## 2018-09-22 DIAGNOSIS — B351 Tinea unguium: Secondary | ICD-10-CM

## 2018-09-22 DIAGNOSIS — M79671 Pain in right foot: Secondary | ICD-10-CM

## 2018-09-22 DIAGNOSIS — M79672 Pain in left foot: Secondary | ICD-10-CM

## 2018-09-22 NOTE — Patient Instructions (Signed)
Seen for hypertrophic nails. All nails debrided. Return as needed.  

## 2018-09-26 ENCOUNTER — Encounter: Payer: Self-pay | Admitting: Podiatry

## 2018-09-26 NOTE — Progress Notes (Signed)
Subjective: 72 y.o. year old male patient presents complaining of painful nails. Patient requests toe nails trimmed. Still working full time.  Objective: Dermatologic: Thick yellow deformed nails x 10. Painful ingrown nails on both great toes. Vascular: Pedal pulses are all palpable. Orthopedic: Contracted lesser digits bilateral. Neurologic: All epicritic and tactile sensations grossly intact.  Assessment: Dystrophic mycotic nails x 10.  Treatment: All mycotic nails, corns, calluses debrided.  Return in 3 months or as needed.

## 2018-11-24 ENCOUNTER — Ambulatory Visit: Payer: Managed Care, Other (non HMO) | Admitting: Podiatry

## 2019-01-12 ENCOUNTER — Ambulatory Visit: Payer: Managed Care, Other (non HMO) | Admitting: Podiatry

## 2019-01-25 ENCOUNTER — Ambulatory Visit: Payer: Managed Care, Other (non HMO)

## 2019-01-25 ENCOUNTER — Ambulatory Visit: Payer: Managed Care, Other (non HMO) | Admitting: Podiatrist

## 2019-01-25 VITALS — BP 177/87 | HR 84

## 2019-01-25 DIAGNOSIS — M21619 Bunion of unspecified foot: Secondary | ICD-10-CM

## 2019-01-25 DIAGNOSIS — M79672 Pain in left foot: Secondary | ICD-10-CM

## 2019-01-25 DIAGNOSIS — L84 Corns and callosities: Secondary | ICD-10-CM

## 2019-01-25 DIAGNOSIS — M79671 Pain in right foot: Secondary | ICD-10-CM

## 2019-01-25 DIAGNOSIS — B351 Tinea unguium: Secondary | ICD-10-CM

## 2019-01-25 DIAGNOSIS — M2041 Other hammer toe(s) (acquired), right foot: Secondary | ICD-10-CM

## 2019-01-25 DIAGNOSIS — M2042 Other hammer toe(s) (acquired), left foot: Secondary | ICD-10-CM

## 2019-01-25 DIAGNOSIS — M79676 Pain in unspecified toe(s): Secondary | ICD-10-CM

## 2019-01-25 DIAGNOSIS — M76822 Posterior tibial tendinitis, left leg: Secondary | ICD-10-CM

## 2019-01-27 ENCOUNTER — Encounter: Payer: Self-pay | Admitting: Podiatrist

## 2019-01-27 NOTE — Progress Notes (Signed)
  Chief Complaint  Patient presents with  . Nail Problem    Bilateral nail trim 1-5  . Callouses    Pt states callouses bilateral 2nd toe tip of digits. Pt states he wears steel toe shoes which may cause this  . Ankle Pain    Pt states occasional left medial ankle discomfort which is not severe     HPI: Patient is 73 y.o. male who presents today for preventive foot and nail care.  He was seen Dr. Salli Real but her office closed abruptly and [facility].  Relates he has occasional left medial ankle discomfort but this is normal and not extremely painful at today's visit   Review of Systems  DATA OBTAINED: from patient  GENERAL: Feels well no fevers, no fatigue, no changes in appetite SKIN: No itching, no rashes, no open wounds EYES: No eye pain,no redness, no discharge EARS: No earache,no ringing of ears, NOSE: No congestion, no drainage, no bleeding  MOUTH/THROAT: No mouth pain, No sore throat, No difficulty chewing or swallowing  RESPIRATORY: No cough, no wheezing, no SOB CARDIAC: No chest pain,no heart palpitations, GI: No abdominal pain, No Nausea, no vomiting, no diarrhea, no heartburn or no reflux  GU: No dysuria, no increased frequency or urgency MUSCULOSKELETAL: Left ankle pain NEUROLOGIC: Awake, alert, appropriate to situation, No change in mental status. PSYCHIATRIC: No overt anxiety or sadness.No behavior issue.      Physical Exam  GENERAL APPEARANCE: Alert, conversant. Appropriately groomed. No acute distress.  VASCULAR: Pedal pulses palpable ans strong DP and PT bilateral.  Capillary refill time is immediate to all digits,  Proximal to distal cooling it warm to warm.   NEUROLOGIC: sensation is intact epicritically and protectively to 5.07 monofilament at 5/5 sites bilateral.  Light touch is intact bilateral, vibratory sensation intact bilateral, achilles tendon reflex is intact bilateral.  MUSCULOSKELETAL: acceptable  muscle strength, tone and stability bilateral.  Intrinsic muscluature intact bilateral.  Bunion deformity present on the right foot hammertoe deformity second digit right fourth and fifth noted left foot has a long second toe comparison to the first and third pes planus foot type noted bilateral.  Discomfort along the posterior tibial tendon of the left foot is noted especially at its insertion site consistent with posterior tibial tendonitis.  DERMATOLOGIC: skin color, texture, and turger are within normal limits.  No preulcerative lesions are seen, hyperkeratotic lesion noted at the tip of the second toes bilateral and fourth toe right patient's toenails are elongated, thickened, discolored, dystrophic with multiple signs of mycotic infection present.   Patient Active Problem List   Diagnosis Date Noted  . Chronic obstructive pulmonary disease (HCC) 06/26/2016  . Occupational exposure to air contaminants 04/23/2016  . History of chronic obstructive airway disease 04/23/2016  . HTN (hypertension) 11/07/2013  . Asthma, chronic 11/07/2013  . BPH (benign prostatic hypertrophy) 11/07/2013    Assessment   1.  Symptomatic onychomycosis, symptomatic calluses x3 2.  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  Debridement of toenails carried out x10 today without complication.  Debridement of calluses x3 also performed. 2.  Recommended ankle brace for the left foot and ankle.  He will be seen back in 3 months for continued preventive foot care.

## 2019-05-24 ENCOUNTER — Ambulatory Visit: Payer: Managed Care, Other (non HMO) | Admitting: Podiatry

## 2019-05-24 ENCOUNTER — Ambulatory Visit: Payer: Managed Care, Other (non HMO) | Admitting: Podiatrist

## 2019-05-24 ENCOUNTER — Encounter: Payer: Self-pay | Admitting: Podiatry

## 2019-05-24 ENCOUNTER — Other Ambulatory Visit: Payer: Self-pay

## 2019-05-24 VITALS — Temp 97.7°F

## 2019-05-24 DIAGNOSIS — M79676 Pain in unspecified toe(s): Secondary | ICD-10-CM

## 2019-05-24 DIAGNOSIS — L84 Corns and callosities: Secondary | ICD-10-CM

## 2019-05-24 DIAGNOSIS — B351 Tinea unguium: Secondary | ICD-10-CM

## 2019-05-24 DIAGNOSIS — M2042 Other hammer toe(s) (acquired), left foot: Secondary | ICD-10-CM

## 2019-05-24 DIAGNOSIS — M2041 Other hammer toe(s) (acquired), right foot: Secondary | ICD-10-CM

## 2019-05-24 NOTE — Progress Notes (Signed)
Complaint:  Visit Type: Patient returns to my office for continued preventative foot care services. Complaint: Patient states" my nails have grown long and thick and become painful to walk and wear shoes"  The patient presents for preventative foot care services. No changes to ROS.  Patient says the corn on second toe both feet are painful walking and wearing his shoes.  He says he walks in steel toes shoes walking over 10 hours at work.  Podiatric Exam: Vascular: dorsalis pedis and posterior tibial pulses are palpable bilateral. Capillary return is immediate. Temperature gradient is WNL. Skin turgor WNL  Sensorium: Normal Semmes Weinstein monofilament test. Normal tactile sensation bilaterally. Nail Exam: Pt has thick disfigured discolored nails with subungual debris noted bilateral entire nail hallux through fifth toenails Ulcer Exam: There is no evidence of ulcer or pre-ulcerative changes or infection. Orthopedic Exam: Muscle tone and strength are WNL. No limitations in general ROM. No crepitus or effusions noted. Foot type and digits show no abnormalities. HAV  B/L. Contracted 4,5 toes right foot..  Pes planus  B/L. Skin: No Porokeratosis. No infection or ulcers.  Clavi second toe  B/L.  Diagnosis:  Onychomycosis, , Pain in right toe, pain in left toes  Treatment & Plan Procedures and Treatment: Consent by patient was obtained for treatment procedures.   Debridement of mycotic and hypertrophic toenails, 1 through 5 bilateral and clearing of subungual debris. No ulceration, no infection noted.  Return Visit-Office Procedure: Patient instructed to return to the office for a follow up visit 3 months for continued evaluation and treatment.    Helane Gunther DPM

## 2019-08-30 ENCOUNTER — Other Ambulatory Visit: Payer: Self-pay

## 2019-08-30 ENCOUNTER — Ambulatory Visit (INDEPENDENT_AMBULATORY_CARE_PROVIDER_SITE_OTHER): Payer: Managed Care, Other (non HMO) | Admitting: Podiatry

## 2019-08-30 ENCOUNTER — Encounter: Payer: Self-pay | Admitting: Podiatry

## 2019-08-30 DIAGNOSIS — L84 Corns and callosities: Secondary | ICD-10-CM

## 2019-08-30 DIAGNOSIS — M2041 Other hammer toe(s) (acquired), right foot: Secondary | ICD-10-CM | POA: Diagnosis not present

## 2019-08-30 DIAGNOSIS — M79676 Pain in unspecified toe(s): Secondary | ICD-10-CM | POA: Diagnosis not present

## 2019-08-30 DIAGNOSIS — M2042 Other hammer toe(s) (acquired), left foot: Secondary | ICD-10-CM

## 2019-08-30 DIAGNOSIS — B351 Tinea unguium: Secondary | ICD-10-CM | POA: Diagnosis not present

## 2019-08-30 NOTE — Progress Notes (Signed)
Complaint:  Visit Type: Patient returns to my office for continued preventative foot care services. Complaint: Patient states" my nails have grown long and thick and become painful to walk and wear shoes"  The patient presents for preventative foot care services. No changes to ROS.  Patient says the corn on second toe both feet are painful walking and wearing his shoes.  He says he walks in steel toes shoes walking over 10 hours at work.  Podiatric Exam: Vascular: dorsalis pedis and posterior tibial pulses are palpable bilateral. Capillary return is immediate. Temperature gradient is WNL. Skin turgor WNL  Sensorium: Normal Semmes Weinstein monofilament test. Normal tactile sensation bilaterally. Nail Exam: Pt has thick disfigured discolored nails with subungual debris noted bilateral entire nail hallux through fifth toenails Ulcer Exam: There is no evidence of ulcer or pre-ulcerative changes or infection. Orthopedic Exam: Muscle tone and strength are WNL. No limitations in general ROM. No crepitus or effusions noted. Foot type and digits show no abnormalities. HAV  B/L. Contracted 4,5 toes right foot..  Pes planus  B/L. Skin: No Porokeratosis. No infection or ulcers.  Clavi second toe  B/L.  Diagnosis:  Onychomycosis, , Pain in right toe, pain in left toes.  Clavi 2nd  B/L  Treatment & Plan Procedures and Treatment: Consent by patient was obtained for treatment procedures.   Debridement of mycotic and hypertrophic toenails, 1 through 5 bilateral and clearing of subungual debris. No ulceration, no infection noted. Debride clavi  X 2.  Prescribe neosporin with lidocaine. Return Visit-Office Procedure: Patient instructed to return to the office for a follow up visit 3 months for continued evaluation and treatment.    Gardiner Barefoot DPM

## 2019-11-28 ENCOUNTER — Encounter: Payer: Self-pay | Admitting: Podiatry

## 2019-11-28 ENCOUNTER — Other Ambulatory Visit: Payer: Self-pay

## 2019-11-28 ENCOUNTER — Ambulatory Visit: Payer: Managed Care, Other (non HMO) | Admitting: Podiatry

## 2019-11-28 DIAGNOSIS — M79676 Pain in unspecified toe(s): Secondary | ICD-10-CM | POA: Diagnosis not present

## 2019-11-28 DIAGNOSIS — L84 Corns and callosities: Secondary | ICD-10-CM | POA: Diagnosis not present

## 2019-11-28 DIAGNOSIS — B351 Tinea unguium: Secondary | ICD-10-CM | POA: Diagnosis not present

## 2019-11-28 DIAGNOSIS — M21619 Bunion of unspecified foot: Secondary | ICD-10-CM

## 2019-11-28 DIAGNOSIS — M76822 Posterior tibial tendinitis, left leg: Secondary | ICD-10-CM

## 2019-11-28 NOTE — Progress Notes (Signed)
Complaint:  Visit Type: Patient returns to my office for continued preventative foot care services. Complaint: Patient states" my nails have grown long and thick and become painful to walk and wear shoes"  The patient presents for preventative foot care services. No changes to ROS.  Patient says the corn on second toe both feet are painful walking and wearing his shoes.  He says he walks in steel toes shoes walking over 10 hours at work.  Podiatric Exam: Vascular: dorsalis pedis and posterior tibial pulses are palpable bilateral. Capillary return is immediate. Temperature gradient is WNL. Skin turgor WNL  Sensorium: Normal Semmes Weinstein monofilament test. Normal tactile sensation bilaterally. Nail Exam: Pt has thick disfigured discolored nails with subungual debris noted bilateral entire nail hallux through fifth toenails Ulcer Exam: There is no evidence of ulcer or pre-ulcerative changes or infection. Orthopedic Exam: Muscle tone and strength are WNL. No limitations in general ROM. No crepitus or effusions noted. Foot type and digits show no abnormalities. HAV  B/L. Contracted 4,5 toes right foot..  Pes planus  B/L. Skin: No Porokeratosis. No infection or ulcers.  Callus right foot.  Asymptomatic  Clavi  B/L.  Diagnosis:  Onychomycosis, , Pain in right toe, pain in left toes.  Clavi 2nd  B/L  Treatment & Plan Procedures and Treatment: Consent by patient was obtained for treatment procedures.   Debridement of mycotic and hypertrophic toenails, 1 through 5 bilateral and clearing of subungual debris. No ulceration, no infection noted.  Return Visit-Office Procedure: Patient instructed to return to the office for a follow up visit 3 months for continued evaluation and treatment.    Gardiner Barefoot DPM

## 2020-01-31 ENCOUNTER — Observation Stay (HOSPITAL_COMMUNITY)
Admission: EM | Admit: 2020-01-31 | Discharge: 2020-02-01 | Disposition: A | Discharge status: Home / Self Care | Payer: Managed Care, Other (non HMO) | Attending: Internal Medicine | Admitting: Internal Medicine

## 2020-01-31 ENCOUNTER — Encounter (HOSPITAL_COMMUNITY): Payer: Self-pay | Admitting: Internal Medicine

## 2020-01-31 ENCOUNTER — Other Ambulatory Visit: Payer: Self-pay

## 2020-01-31 ENCOUNTER — Emergency Department (HOSPITAL_COMMUNITY): Payer: Managed Care, Other (non HMO)

## 2020-01-31 DIAGNOSIS — R739 Hyperglycemia, unspecified: Secondary | ICD-10-CM | POA: Diagnosis present

## 2020-01-31 DIAGNOSIS — Z79899 Other long term (current) drug therapy: Secondary | ICD-10-CM | POA: Insufficient documentation

## 2020-01-31 DIAGNOSIS — R000 Tachycardia, unspecified: Secondary | ICD-10-CM | POA: Diagnosis present

## 2020-01-31 DIAGNOSIS — Z8249 Family history of ischemic heart disease and other diseases of the circulatory system: Secondary | ICD-10-CM | POA: Diagnosis not present

## 2020-01-31 DIAGNOSIS — R600 Localized edema: Secondary | ICD-10-CM | POA: Insufficient documentation

## 2020-01-31 DIAGNOSIS — R0682 Tachypnea, not elsewhere classified: Secondary | ICD-10-CM | POA: Insufficient documentation

## 2020-01-31 DIAGNOSIS — Z7951 Long term (current) use of inhaled steroids: Secondary | ICD-10-CM | POA: Insufficient documentation

## 2020-01-31 DIAGNOSIS — J45901 Unspecified asthma with (acute) exacerbation: Secondary | ICD-10-CM | POA: Diagnosis present

## 2020-01-31 DIAGNOSIS — Z7982 Long term (current) use of aspirin: Secondary | ICD-10-CM | POA: Insufficient documentation

## 2020-01-31 DIAGNOSIS — J4521 Mild intermittent asthma with (acute) exacerbation: Principal | ICD-10-CM | POA: Insufficient documentation

## 2020-01-31 DIAGNOSIS — N400 Enlarged prostate without lower urinary tract symptoms: Secondary | ICD-10-CM | POA: Insufficient documentation

## 2020-01-31 DIAGNOSIS — I10 Essential (primary) hypertension: Secondary | ICD-10-CM | POA: Diagnosis not present

## 2020-01-31 DIAGNOSIS — Z20822 Contact with and (suspected) exposure to covid-19: Secondary | ICD-10-CM | POA: Diagnosis not present

## 2020-01-31 LAB — CBC WITH DIFFERENTIAL/PLATELET
Abs Immature Granulocytes: 0.11 10*3/uL — ABNORMAL HIGH (ref 0.00–0.07)
Basophils Absolute: 0.1 10*3/uL (ref 0.0–0.1)
Basophils Relative: 0 %
Eosinophils Absolute: 0.2 10*3/uL (ref 0.0–0.5)
Eosinophils Relative: 2 %
HCT: 37.8 % — ABNORMAL LOW (ref 39.0–52.0)
Hemoglobin: 11.6 g/dL — ABNORMAL LOW (ref 13.0–17.0)
Immature Granulocytes: 1 %
Lymphocytes Relative: 5 %
Lymphs Abs: 0.6 10*3/uL — ABNORMAL LOW (ref 0.7–4.0)
MCH: 26.4 pg (ref 26.0–34.0)
MCHC: 30.7 g/dL (ref 30.0–36.0)
MCV: 86.1 fL (ref 80.0–100.0)
Monocytes Absolute: 0.4 10*3/uL (ref 0.1–1.0)
Monocytes Relative: 4 %
Neutro Abs: 10.4 10*3/uL — ABNORMAL HIGH (ref 1.7–7.7)
Neutrophils Relative %: 88 %
Platelets: 250 10*3/uL (ref 150–400)
RBC: 4.39 MIL/uL (ref 4.22–5.81)
RDW: 15.6 % — ABNORMAL HIGH (ref 11.5–15.5)
WBC: 11.8 10*3/uL — ABNORMAL HIGH (ref 4.0–10.5)
nRBC: 0 % (ref 0.0–0.2)

## 2020-01-31 LAB — COMPREHENSIVE METABOLIC PANEL
ALT: 20 U/L (ref 0–44)
AST: 16 U/L (ref 15–41)
Albumin: 3.7 g/dL (ref 3.5–5.0)
Alkaline Phosphatase: 67 U/L (ref 38–126)
Anion gap: 10 (ref 5–15)
BUN: 26 mg/dL — ABNORMAL HIGH (ref 8–23)
CO2: 25 mmol/L (ref 22–32)
Calcium: 8.9 mg/dL (ref 8.9–10.3)
Chloride: 103 mmol/L (ref 98–111)
Creatinine, Ser: 1.08 mg/dL (ref 0.61–1.24)
GFR calc Af Amer: >60 mL/min (ref >60)
GFR calc non Af Amer: >60 mL/min (ref >60)
Glucose, Bld: 162 mg/dL — ABNORMAL HIGH (ref 70–99)
Potassium: 3.7 mmol/L (ref 3.5–5.1)
Sodium: 138 mmol/L (ref 135–145)
Total Bilirubin: 0.4 mg/dL (ref 0.3–1.2)
Total Protein: 7.3 g/dL (ref 6.5–8.1)

## 2020-01-31 LAB — TROPONIN I (HIGH SENSITIVITY)
Troponin I (High Sensitivity): 13 ng/L (ref <18)
Troponin I (High Sensitivity): 23 ng/L — ABNORMAL HIGH (ref <18)

## 2020-01-31 LAB — RESPIRATORY PANEL BY RT PCR (FLU A&B, COVID)
Influenza A by PCR: NEGATIVE
Influenza B by PCR: NEGATIVE
SARS Coronavirus 2 by RT PCR: NEGATIVE

## 2020-01-31 LAB — BRAIN NATRIURETIC PEPTIDE: B Natriuretic Peptide: 28.8 pg/mL (ref 0.0–100.0)

## 2020-01-31 MED ORDER — ENOXAPARIN SODIUM 40 MG/0.4ML ~~LOC~~ SOLN
40.0000 mg | SUBCUTANEOUS | Status: DC
  Administered 2020-01-31: 40 mg via SUBCUTANEOUS
  Filled 2020-01-31: qty 0.4

## 2020-01-31 MED ORDER — ONDANSETRON HCL 4 MG/2ML IJ SOLN: 4.0000 mg | Freq: Four times a day (QID) | INTRAMUSCULAR | Status: DC | PRN

## 2020-01-31 MED ORDER — ONDANSETRON HCL 4 MG PO TABS: 4.0000 mg | ORAL_TABLET | Freq: Four times a day (QID) | ORAL | Status: DC | PRN

## 2020-01-31 MED ORDER — ALBUTEROL SULFATE HFA 108 (90 BASE) MCG/ACT IN AERS
2.0000 | INHALATION_SPRAY | RESPIRATORY_TRACT | Status: DC | PRN
  Filled 2020-01-31: qty 6.7

## 2020-01-31 MED ORDER — AZITHROMYCIN 250 MG PO TABS
500.0000 mg | ORAL_TABLET | Freq: Every day | ORAL | Status: AC
  Administered 2020-01-31: 500 mg via ORAL
  Filled 2020-01-31: qty 2

## 2020-01-31 MED ORDER — ACETAMINOPHEN 325 MG PO TABS: 650.0000 mg | ORAL_TABLET | Freq: Four times a day (QID) | ORAL | Status: DC | PRN

## 2020-01-31 MED ORDER — ALBUTEROL SULFATE HFA 108 (90 BASE) MCG/ACT IN AERS: 2.0000 | INHALATION_SPRAY | RESPIRATORY_TRACT | Status: DC | PRN

## 2020-01-31 MED ORDER — ACETAMINOPHEN 650 MG RE SUPP: 650.0000 mg | Freq: Four times a day (QID) | RECTAL | Status: DC | PRN

## 2020-01-31 MED ORDER — AZITHROMYCIN 250 MG PO TABS
250.0000 mg | ORAL_TABLET | Freq: Every day | ORAL | Status: DC
  Administered 2020-02-01: 250 mg via ORAL
  Filled 2020-01-31: qty 1

## 2020-01-31 MED ORDER — METHYLPREDNISOLONE SODIUM SUCC 125 MG IJ SOLR
125.0000 mg | Freq: Once | INTRAMUSCULAR | Status: AC
  Administered 2020-01-31: 18:00:00 125 mg via INTRAVENOUS
  Filled 2020-01-31: qty 2

## 2020-01-31 MED ORDER — MAGNESIUM SULFATE 2 GM/50ML IV SOLN
2.0000 g | Freq: Once | INTRAVENOUS | Status: AC
  Administered 2020-01-31: 2 g via INTRAVENOUS
  Filled 2020-01-31: qty 50

## 2020-01-31 MED ORDER — LABETALOL HCL 5 MG/ML IV SOLN: 10.0000 mg | Freq: Three times a day (TID) | INTRAVENOUS | Status: DC | PRN

## 2020-01-31 NOTE — ED Triage Notes (Signed)
Pt BIBA from work-  Per EMS- Pt was at work, reports being "around new hand sanitizer."  Pt with hx of asthma, wheezing in all fields. Pt tripoding on EMS arrival, with one urinary occurrence. Ambulatory on scene.   EMS gave 125 solumedrol, 2 g mg, 5 mg albuterol inhaler.   18 g L FA.   Pt arrived to ED on NRB 10 L. AOx4.

## 2020-01-31 NOTE — ED Provider Notes (Signed)
[facility]-EMERGENCY DEPT Provider Note   CSN: [id number] Arrival date & time: 01/31/20  1613     History Chief Complaint  Patient presents with  . Asthma  . Shortness of Breath    Bryan Fitzgerald is a 74 y.o. male.  Patient complains of shortness of breath.  Patient has a history of asthma and has albuterol inhalers not helping  The history is provided by the patient. No language interpreter was used.  Asthma This is a recurrent problem. The current episode started more than 2 days ago. The problem occurs constantly. The problem has not changed since onset.Associated symptoms include chest pain and shortness of breath. Pertinent negatives include no abdominal pain and no headaches. Nothing aggravates the symptoms. Nothing relieves the symptoms.  Shortness of Breath Associated symptoms: chest pain   Associated symptoms: no abdominal pain, no cough, no headaches and no rash        Past Medical History:  Diagnosis Date  . Asthma   . Hypertension   . Seasonal allergies     Patient Active Problem List   Diagnosis Date Noted  . Chronic obstructive pulmonary disease (HCC) 06/26/2016  . Occupational exposure to air contaminants 04/23/2016  . History of chronic obstructive airway disease 04/23/2016  . HTN (hypertension) 11/07/2013  . Asthma, chronic 11/07/2013  . BPH (benign prostatic hypertrophy) 11/07/2013    No past surgical history on file.     Family History  Problem Relation Age of Onset  . Heart attack Mother     Social History   Tobacco Use  . Smoking status: Never Smoker  . Smokeless tobacco: Never Used  Substance Use Topics  . Alcohol use: No    Alcohol/week: 0.0 standard drinks  . Drug use: No    Home Medications Prior to Admission medications   Medication Sig Start Date End Date Taking? Authorizing Provider  ADVAIR DISKUS 250-50 MCG/DOSE AEPB Inhale 1 puff into the lungs daily.  01/22/20  Yes [provider]  albuterol  (PROVENTIL HFA;VENTOLIN HFA) 108 (90 BASE) MCG/ACT inhaler Inhale into the lungs every 6 (six) hours as needed for wheezing or shortness of breath.   Yes [provider]  amLODipine (NORVASC) 2.5 MG tablet Take 2.5 mg by mouth daily. 10/10/18  Yes [provider]  aspirin 81 MG tablet Take 81 mg by mouth daily.   Yes [provider]  doxazosin (CARDURA) 8 MG tablet Take 8 mg by mouth every evening.  01/04/19  Yes [provider]  finasteride (PROSCAR) 5 MG tablet Take 5 mg by mouth every evening.    Yes [provider]  GARLIC PO Take 1 capsule by mouth daily.   Yes [provider]  losartan-hydrochlorothiazide (HYZAAR) 100-12.5 MG tablet Take 1 tablet by mouth daily.  08/26/19  Yes [provider]  montelukast (SINGULAIR) 10 MG tablet Take 10 mg by mouth daily.    Yes [provider]  Multiple Vitamin (MULTIVITAMIN ADULT PO) Take 1 tablet by mouth daily.   Yes [provider]  naproxen sodium (ALEVE) 220 MG tablet Take 220 mg by mouth daily as needed (pain).   Yes [provider]  SYMBICORT 160-4.5 MCG/ACT inhaler Inhale 2 puffs into the lungs daily.  10/10/18  Yes [provider]  fluticasone furoate-vilanterol (BREO ELLIPTA) 200-25 MCG/INH AEPB Inhale 1 puff into the lungs daily. Patient not taking: Reported on 01/31/2020 06/26/16   Kalman Shan, MD    Allergies    Patient has  no known allergies.  Review of Systems   Review of Systems  Constitutional: Negative for appetite change and fatigue.  HENT: Negative for congestion, ear discharge and sinus pressure.   Eyes: Negative for discharge.  Respiratory: Positive for shortness of breath. Negative for cough.   Cardiovascular: Positive for chest pain.  Gastrointestinal: Negative for abdominal pain and diarrhea.  Genitourinary: Negative for frequency and hematuria.  Musculoskeletal: Negative for back pain.  Skin: Negative for rash.    Neurological: Negative for seizures and headaches.  Psychiatric/Behavioral: Negative for hallucinations.    Physical Exam Updated Vital Signs BP (!) 186/108   Pulse (!) 113   Temp 98 F (36.7 C) (Oral)   Resp (!) 26   SpO2 95%   Physical Exam Vitals and nursing note reviewed.  Constitutional:      Appearance: He is well-developed.  HENT:     Head: Normocephalic.  Eyes:     General: No scleral icterus.    Conjunctiva/sclera: Conjunctivae normal.  Neck:     Thyroid: No thyromegaly.  Cardiovascular:     Rate and Rhythm: Normal rate and regular rhythm.     Heart sounds: No murmur. No friction rub. No gallop.   Pulmonary:     Breath sounds: No stridor. Wheezing present. No rales.  Chest:     Chest wall: No tenderness.  Abdominal:     General: There is no distension.     Tenderness: There is no abdominal tenderness. There is no rebound.  Musculoskeletal:        General: Normal range of motion.     Cervical back: Neck supple.  Lymphadenopathy:     Cervical: No cervical adenopathy.  Skin:    Findings: No erythema or rash.  Neurological:     Mental Status: He is oriented to person, place, and time.     Motor: No abnormal muscle tone.     Coordination: Coordination normal.  Psychiatric:        Behavior: Behavior normal.     ED Results / Procedures / Treatments   Labs (all labs ordered are listed, but only abnormal results are displayed) Labs Reviewed  CBC WITH DIFFERENTIAL/PLATELET - Abnormal; Notable for the following components:      Result Value   WBC 11.8 (*)    Hemoglobin 11.6 (*)    HCT 37.8 (*)    RDW 15.6 (*)    Neutro Abs 10.4 (*)    Lymphs Abs 0.6 (*)    Abs Immature Granulocytes 0.11 (*)    All other components within normal limits  COMPREHENSIVE METABOLIC PANEL - Abnormal; Notable for the following components:   Glucose, Bld 162 (*)    BUN 26 (*)    All other components within normal limits  RESPIRATORY PANEL BY RT PCR (FLU A&B, COVID)  BRAIN  NATRIURETIC PEPTIDE  TROPONIN I (HIGH SENSITIVITY)  TROPONIN I (HIGH SENSITIVITY)    EKG None  Radiology DG Chest Port 1 View  Result Date: 01/31/2020 CLINICAL DATA:  Shortness of breath EXAM: PORTABLE CHEST 1 VIEW COMPARISON:  10/12/2014 FINDINGS: Heart is normal size. Mild peribronchial thickening and perihilar interstitial prominence. No confluent opacities or effusions. No acute bony abnormality. IMPRESSION: Peribronchial thickening and perihilar interstitial prominence could reflect bronchitic changes, less likely edema. Electronically Signed   By: Rolm Baptise M.D.   On: 01/31/2020 18:10    Procedures Procedures (including critical care time)  Medications Ordered in ED Medications  albuterol (VENTOLIN HFA) 108 (90 Base) MCG/ACT inhaler 2  puff (has no administration in time range)  methylPREDNISolone sodium succinate (SOLU-MEDROL) 125 mg/2 mL injection 125 mg (125 mg Intravenous Given 01/31/20 1746)  magnesium sulfate IVPB 2 g 50 mL (0 g Intravenous Stopped 01/31/20 1854)    ED Course  I have reviewed the triage vital signs and the nursing notes.  Pertinent labs & imaging results that were available during my care of the patient were reviewed by me and considered in my medical decision making (see chart for details).    MDM Rules/Calculators/A&P                      Patient with exacerbation of his asthma.  Patient continued to be tachycardic and tachypneic after treatment.  He will be admitted to medicine for asthma exacerbation Final Clinical Impression(s) / ED Diagnoses Final diagnoses:  Exacerbation of intermittent asthma, unspecified asthma severity    Rx / DC Orders ED Discharge Orders    None       Bethann Berkshire, MD 01/31/20 2018

## 2020-01-31 NOTE — H&P (Signed)
History and Physical    Bryan Fitzgerald OMB:[id number] DOB: October 02, 1946 DOA: 01/31/2020  PCP: Corine Shelter, MD (Confirm with patient/family/NH records and if not entered, this has to be entered at [facility] point of entry) Patient coming from: Home  I have personally briefly reviewed patient's old medical records in [facility]  Chief Complaint: Worsening shortness of breath with wheezing  HPI: Bryan Fitzgerald is a 74 y.o. male with medical history significant of asthma, hypertension and seasonal allergies presented to ED for evaluation of worsening shortness of breath with wheezing.  Patient states that the problem started today around 2:30 PM while she was at work.  Patient states that she was sitting in the break room when a member of cleaning service cleaned her table with disinfectant wipes and as soon as the smell entered her nose she started having difficulty with breathing and worsening wheezing.  Patient states that she used her home inhalers but it did not improve her symptoms that is why she came to the hospital for further help.  The symptoms were aggravated and alleviated with nothing.  Patient states that similar episodes happened to her multiple times in the past.  Patient is also admitting of having episodic productive cough for the last 3 to 4 days.  Patient otherwise denies fever, chills, sore throat, loss of taste and smell sensation, chest pain and contact with any sick individual recently.  Patient also denies nausea, vomiting, abdominal pain, urinary symptoms and edema.  ED Course: On arrival to the hospital patient had temperature of 36.7, heart rate 126, respiratory rate 45, blood pressure 163/81 and was on 10 L nonrebreather mask and later on needed 2 L of oxygen with nasal cannula to maintain oxygen saturation within normal limits.  Blood work only showed elevated glucose of 162 while rest of the labs within normal limits.  COVID-19 test ordered and result is pending.  Chest  x-[place] showed peribronchial thickening and perihilar interstitial prominence.  Patient was given IV Solu-Medrol and 2 doses of albuterol inhaler by EMS and was brought on nonrebreather 10 L to the ED.  Review of Systems: As per HPI otherwise 10 point review of systems negative.   Past Medical History:  Diagnosis Date   Asthma    Hypertension    Seasonal allergies     No past surgical history on file.   reports that he has never smoked. He has never used smokeless tobacco. He reports that he does not drink alcohol or use drugs.  No Known Allergies  Family History  Problem Relation Age of Onset   Heart attack Mother      Prior to Admission medications   Medication Sig Start Date End Date Taking? Authorizing Provider  ADVAIR DISKUS 250-50 MCG/DOSE AEPB Inhale 1 puff into the lungs daily.  01/22/20  Yes [provider]  albuterol (PROVENTIL HFA;VENTOLIN HFA) 108 (90 BASE) MCG/ACT inhaler Inhale into the lungs every 6 (six) hours as needed for wheezing or shortness of breath.   Yes [provider]  amLODipine (NORVASC) 2.5 MG tablet Take 2.5 mg by mouth daily. 10/10/18  Yes [provider]  aspirin 81 MG tablet Take 81 mg by mouth daily.   Yes [provider]  doxazosin (CARDURA) 8 MG tablet Take 8 mg by mouth every evening.  01/04/19  Yes [provider]  finasteride (PROSCAR) 5 MG tablet Take 5 mg by mouth every evening.    Yes [provider]  GARLIC PO  Take 1 capsule by mouth daily.   Yes [provider]  losartan-hydrochlorothiazide (HYZAAR) 100-12.5 MG tablet Take 1 tablet by mouth daily.  08/26/19  Yes [provider]  montelukast (SINGULAIR) 10 MG tablet Take 10 mg by mouth daily.    Yes [provider]  Multiple Vitamin (MULTIVITAMIN ADULT PO) Take 1 tablet by mouth daily.   Yes [provider]  naproxen sodium (ALEVE) 220 MG tablet Take 220 mg by mouth daily as needed (pain).   Yes  [provider]  SYMBICORT 160-4.5 MCG/ACT inhaler Inhale 2 puffs into the lungs daily.  10/10/18  Yes [provider]  fluticasone furoate-vilanterol (BREO ELLIPTA) 200-25 MCG/INH AEPB Inhale 1 puff into the lungs daily. Patient not taking: Reported on 01/31/2020 06/26/16   Kalman Shan, MD    Physical Exam: Vitals:   01/31/20 1747 01/31/20 1800 01/31/20 1930 01/31/20 2000  BP: (!) 152/73 (!) 154/94 (!) 182/85 (!) 186/108  Pulse: (!) 119 (!) 118 (!) 111 (!) 113  Resp: (!) 22 (!) 31 (!) 26 (!) 26  Temp:      TempSrc:      SpO2: 97% 94% 94% 95%    Constitutional: NAD, calm, comfortable Vitals:   01/31/20 1747 01/31/20 1800 01/31/20 1930 01/31/20 2000  BP: (!) 152/73 (!) 154/94 (!) 182/85 (!) 186/108  Pulse: (!) 119 (!) 118 (!) 111 (!) 113  Resp: (!) 22 (!) 31 (!) 26 (!) 26  Temp:      TempSrc:      SpO2: 97% 94% 94% 95%   Eyes: PERRL, lids and conjunctivae normal ENMT: Mucous membranes are moist. Posterior pharynx clear of any exudate or lesions.Normal dentition.  Neck: normal, supple, no masses, no thyromegaly Respiratory: Patient is on 2 L of oxygen with nasal cannula at this time.  Chest auscultation shows diminished breath sounds in bilateral lung bases with diffuse mild bilateral wheezing. Normal respiratory effort. No accessory muscle use.  Cardiovascular: Regular rhythm, tachycardia, no murmurs / rubs / gallops. No extremity edema. 2+ pedal pulses. No carotid bruits.  Abdomen: no tenderness, no masses palpated. No hepatosplenomegaly. Bowel sounds positive.  Musculoskeletal: no clubbing / cyanosis. No joint deformity upper and lower extremities. Good ROM, no contractures. Normal muscle tone.  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 No signs of acute inflammation. Neurologic: CN 2-12 grossly intact. Sensation intact, DTR normal. Strength 5/5 in all 4.  Psychiatric: Normal judgment and insight. Alert and oriented x 3. Normal mood.    Labs on Admission: I have personally reviewed following labs and imaging studies  CBC: Recent Labs  Lab 01/31/20 1730  WBC 11.8*  NEUTROABS 10.4*  HGB 11.6*  HCT 37.8*  MCV 86.1  PLT 250   Basic Metabolic Panel: Recent Labs  Lab 01/31/20 1730  NA 138  K 3.7  CL 103  CO2 25  GLUCOSE 162*  BUN 26*  CREATININE 1.08  CALCIUM 8.9   GFR: CrCl cannot be calculated (Unknown ideal weight.). Liver Function Tests: Recent Labs  Lab 01/31/20 1730  AST 16  ALT 20  ALKPHOS 67  BILITOT 0.4  PROT 7.3  ALBUMIN 3.7   No results for input(s): LIPASE, AMYLASE in the last 168 hours. No results for input(s): AMMONIA in the last 168 hours. Coagulation Profile: No results for input(s): INR, PROTIME in the last 168 hours. Cardiac Enzymes: No results for input(s): CKTOTAL, CKMB, CKMBINDEX, TROPONINI in the last 168 hours. BNP (last 3 results) No results for  input(s): PROBNP in the last 8760 hours. HbA1C: No results for input(s): HGBA1C in the last 72 hours. CBG: No results for input(s): GLUCAP in the last 168 hours. Lipid Profile: No results for input(s): CHOL, HDL, LDLCALC, TRIG, CHOLHDL, LDLDIRECT in the last 72 hours. Thyroid Function Tests: No results for input(s): TSH, T4TOTAL, FREET4, T3FREE, THYROIDAB in the last 72 hours. Anemia Panel: No results for input(s): VITAMINB12, FOLATE, FERRITIN, TIBC, IRON, RETICCTPCT in the last 72 hours. Urine analysis: No results found for: COLORURINE, APPEARANCEUR, [place], [place], [place], [place], [place], [place], [place], [place], NITRITE, LEUKOCYTESUR  Radiological Exams on Admission: DG Chest Port 1 View  Result Date: 01/31/2020 CLINICAL DATA:  Shortness of breath EXAM: PORTABLE CHEST 1 VIEW COMPARISON:  10/12/2014 FINDINGS: Heart is normal size. Mild peribronchial thickening and perihilar interstitial prominence. No confluent opacities or effusions. No acute bony abnormality. IMPRESSION: Peribronchial thickening and  perihilar interstitial prominence could reflect bronchitic changes, less likely edema. Electronically Signed   By: Rolm Baptise M.D.   On: 01/31/2020 18:10      Assessment/Plan Principal Problem:   Acute asthma exacerbation Patient is having acute allergic asthma exacerbation. Continue oxygen supplementation with nasal cannula as needed Continue albuterol inhaler 2 puffs every 4 hours as needed for wheezing and shortness of breath Z-Pak ordered for episodic productive cough in the setting of asthma exacerbation. Patient was given 1 dose of IV Solu-Medrol 125 mg by EMS for wheezing which resulted in improvement of wheezing.  Continue to monitor the patient and will order IV Solu-Medrol if needed.  Active Problems:   Accelerated hypertension Blood pressure was 163/81 on arrival to the hospital and during my evaluation the blood pressure was 176/83.  IV labetalol 10 mg every 8 hour as needed ordered if the systolic blood pressure is above 948 or diastolic blood pressure above 105.    Hyperglycemia Patient has mildly elevated blood glucose and has no history of diabetes.  Continue to monitor the blood glucose level in next BMP.  Plan is to start the patient on sliding scale insulin if the blood glucose is still high during the next BMP.    Tachycardia  patient was tachycardic with sinus tachycardia seen on EKG.  Tachycardia is most probably secondary to anxiety from asthma exacerbation.  Patient is admitted to telemetry for further observation. IV labetalol 10 mg if the heart rate is above 120.   DVT prophylaxis: Lovenox Code Status: Full code Family Communication:  Disposition Plan: Patient will be discharged to home once his symptoms improved. Consults called: Not required Admission status: Full code   Edmonia Lynch MD [facility] Pager   If 7PM-7AM, please contact night-coverage [email or web address] Password   01/31/2020, 9:33 PM

## 2020-02-01 ENCOUNTER — Observation Stay (HOSPITAL_BASED_OUTPATIENT_CLINIC_OR_DEPARTMENT_OTHER): Payer: Managed Care, Other (non HMO)

## 2020-02-01 DIAGNOSIS — R739 Hyperglycemia, unspecified: Secondary | ICD-10-CM

## 2020-02-01 DIAGNOSIS — J45901 Unspecified asthma with (acute) exacerbation: Secondary | ICD-10-CM | POA: Diagnosis not present

## 2020-02-01 DIAGNOSIS — J4521 Mild intermittent asthma with (acute) exacerbation: Secondary | ICD-10-CM

## 2020-02-01 DIAGNOSIS — R609 Edema, unspecified: Secondary | ICD-10-CM

## 2020-02-01 DIAGNOSIS — I10 Essential (primary) hypertension: Secondary | ICD-10-CM

## 2020-02-01 LAB — COMPREHENSIVE METABOLIC PANEL
ALT: 18 U/L (ref 0–44)
AST: 16 U/L (ref 15–41)
Albumin: 3.5 g/dL (ref 3.5–5.0)
Alkaline Phosphatase: 57 U/L (ref 38–126)
Anion gap: 8 (ref 5–15)
BUN: 25 mg/dL — ABNORMAL HIGH (ref 8–23)
CO2: 28 mmol/L (ref 22–32)
Calcium: 9.1 mg/dL (ref 8.9–10.3)
Chloride: 106 mmol/L (ref 98–111)
Creatinine, Ser: 1.02 mg/dL (ref 0.61–1.24)
GFR calc Af Amer: >60 mL/min (ref >60)
GFR calc non Af Amer: >60 mL/min (ref >60)
Glucose, Bld: 127 mg/dL — ABNORMAL HIGH (ref 70–99)
Potassium: 5 mmol/L (ref 3.5–5.1)
Sodium: 142 mmol/L (ref 135–145)
Total Bilirubin: 0.4 mg/dL (ref 0.3–1.2)
Total Protein: 7 g/dL (ref 6.5–8.1)

## 2020-02-01 LAB — CBC
HCT: 37.5 % — ABNORMAL LOW (ref 39.0–52.0)
Hemoglobin: 11.3 g/dL — ABNORMAL LOW (ref 13.0–17.0)
MCH: 25.8 pg — ABNORMAL LOW (ref 26.0–34.0)
MCHC: 30.1 g/dL (ref 30.0–36.0)
MCV: 85.6 fL (ref 80.0–100.0)
Platelets: 242 10*3/uL (ref 150–400)
RBC: 4.38 MIL/uL (ref 4.22–5.81)
RDW: 15.6 % — ABNORMAL HIGH (ref 11.5–15.5)
WBC: 11.8 10*3/uL — ABNORMAL HIGH (ref 4.0–10.5)
nRBC: 0 % (ref 0.0–0.2)

## 2020-02-01 MED ORDER — ACETAMINOPHEN 325 MG PO TABS: 650.0000 mg | ORAL_TABLET | Freq: Four times a day (QID) | ORAL | Status: DC | PRN

## 2020-02-01 MED ORDER — PREDNISONE 10 MG PO TABS: 10.0000 mg | ORAL_TABLET | Freq: Every day | ORAL | 0 refills | Status: DC

## 2020-02-01 MED ORDER — LOSARTAN POTASSIUM-HCTZ 100-12.5 MG PO TABS: 1.0000 | ORAL_TABLET | Freq: Every day | ORAL | 2 refills | Status: DC

## 2020-02-01 NOTE — Evaluation (Signed)
Physical Therapy Evaluation Patient Details Name: Bryan Fitzgerald MRN: [id number] DOB: Feb 25, 1946 Today's Date: 02/01/2020   History of Present Illness  Pt 74 y.o. male with medical history significant of asthma, hypertension and seasonal allergies presented to ED for evaluation of worsening shortness of breath with wheezing.  Pt admitted with acute asthma exacerbation.    Clinical Impression  Pt admitted with above diagnosis. Pt was able to ambulate and transfer independently without dyspnea and with stable O2 saturations.  Presenting with baseline mobility and has been ambulating in room independently.  No further PT indicated.     Follow Up Recommendations No PT follow up    Equipment Recommendations  None recommended by PT    Recommendations for Other Services       Precautions / Restrictions Precautions Precautions: None      Mobility  Bed Mobility Overal bed mobility: Independent                Transfers Overall transfer level: Needs assistance Equipment used: None Transfers: Sit to/from Stand Sit to Stand: Supervision            Ambulation/Gait Ambulation/Gait assistance: Supervision Gait Distance (Feet): 250 Feet Assistive device: None Gait Pattern/deviations: WFL(Within Functional Limits) Gait velocity: normal   General Gait Details: denied dyspnea; O2 sats 96%;  HR 100-123 bpm; steady no LOB; pt reports has been walking to bathroom independently  Stairs            Wheelchair Mobility    Modified Rankin (Stroke Patients Only)       Balance Overall balance assessment: Needs assistance Sitting-balance support: No upper extremity supported;Feet supported Sitting balance-Leahy Scale: Normal     Standing balance support: No upper extremity supported;During functional activity Standing balance-Leahy Scale: Normal                               Pertinent Vitals/Pain Pain Assessment: No/denies pain    Home Living  Family/patient expects to be discharged to:: Private residence Living Arrangements: Spouse/significant other Available Help at Discharge: Family;Available PRN/intermittently Type of Home: House Home Access: Stairs to enter Entrance Stairs-Rails: None Entrance Stairs-Number of Steps: 2 Home Layout: Multi-level;Bed/bath upstairs        Prior Function Level of Independence: Independent         Comments: 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, on feet all day     Hand Dominance        Extremity/Trunk Assessment   Upper Extremity Assessment Upper Extremity Assessment: Overall WFL for tasks assessed    Lower Extremity Assessment Lower Extremity Assessment: Overall WFL for tasks assessed    Cervical / Trunk Assessment Cervical / Trunk Assessment: Normal  Communication   Communication: No difficulties  Cognition Arousal/Alertness: Awake/alert Behavior During Therapy: WFL for tasks assessed/performed Overall Cognitive Status: Within Functional Limits for tasks assessed                                        General Comments      Exercises     Assessment/Plan    PT Assessment Patent does not need any further PT services  PT Problem List         PT Treatment Interventions      PT Goals (Current goals can be found in the Care Plan section)  Acute  Rehab PT Goals Patient Stated Goal: return home PT Goal Formulation: With patient Time For Goal Achievement: 02/15/20 Potential to Achieve Goals: Good    Frequency     Barriers to discharge        Co-evaluation               AM-PAC PT "6 Clicks" Mobility  Outcome Measure Help needed turning from your back to your side while in a flat bed without using bedrails?: None Help needed moving from lying on your back to sitting on the side of a flat bed without using bedrails?: None Help needed moving to and from a bed to a chair (including a wheelchair)?: None Help  needed standing up from a chair using your arms (e.g., wheelchair or bedside chair)?: None Help needed to walk in hospital room?: None Help needed climbing 3-5 steps with a railing? : None 6 Click Score: 24    End of Session   Activity Tolerance: Patient tolerated treatment well Patient left: Other (comment)(in bathroom having BM ( has been getting up independently))        Time: 1236-1256 PT Time Calculation (min) (ACUTE ONLY): 20 min   Charges:   PT Evaluation $PT Eval Low Complexity: 1 Low          Bryan Fitzgerald, PT Acute Rehab Services Pager [phone] [facility] [phone] [facility] [phone]   Bryan Fitzgerald 02/01/2020, 1:39 PM

## 2020-02-01 NOTE — Discharge Summary (Signed)
Physician Discharge Summary  KROSS SWALLOWS HYQ:[id number] DOB: 06/28/1946 DOA: 01/31/2020  PCP: Vincente Liberty, MD  Admit date: 01/31/2020 Discharge date: 02/01/2020  Admitted From: Home Disposition: Home Recommendations for Outpatient Follow-up:  1. Follow up with PCP in 1-2 weeks 2. Please obtain BMP/CBC in one week 3. Please follow up on the following pending results:  Home Health: None  equipment/Devices: None Discharge Condition stable and improved CODE STATUS: Full code Diet recommendation: Cardiac diet  brief/Interim Summary: 74 y.o. male with medical history significant of asthma, hypertension and seasonal allergies presented to ED for evaluation of worsening shortness of breath with wheezing.  Patient states that the problem started today around 2:30 PM while she was at work.  Patient states that she was sitting in the break room when a member of cleaning service cleaned her table with disinfectant wipes and as soon as the smell entered her nose she started having difficulty with breathing and worsening wheezing.  Patient states that she used her home inhalers but it did not improve her symptoms that is why she came to the hospital for further help.  The symptoms were aggravated and alleviated with nothing.  Patient states that similar episodes happened to her multiple times in the past.  Patient is also admitting of having episodic productive cough for the last 3 to 4 days.  Patient otherwise denies fever, chills, sore throat, loss of taste and smell sensation, chest pain and contact with any sick individual recently.  Patient also denies nausea, vomiting, abdominal pain, urinary symptoms and edema.  ED Course: On arrival to the hospital patient had temperature of 36.7, heart rate 126, respiratory rate 45, blood pressure 163/81 and was on 10 L nonrebreather mask and later on needed 2 L of oxygen with nasal cannula to maintain oxygen saturation within normal limits.  Blood work only  showed elevated glucose of 162 while rest of the labs within normal limits.  COVID-19 test ordered and result is pending.  Chest x-Stimpson showed peribronchial thickening and perihilar interstitial prominence.  Patient was given IV Solu-Medrol and 2 doses of albuterol inhaler by EMS and was brought on nonrebreather 10 L to the ED.  Discharge Diagnoses:  Principal Problem:   Acute asthma exacerbation Active Problems:   Accelerated hypertension   Hyperglycemia   Tachycardia  #1 acute asthma exacerbation secondary to exposure to any dyes or spray at work.  Patient reported he was at work and the cleaning lady had just sprayed the sanitizer all over the room.  He normally wears a mask at work but he had taken it off to come to the break room.  He otherwise feels well he denied fever.  And is anxious to be discharged today.  He walked with physical therapy it was reported by the nurse that his saturation was about 92% and he was on room air all morning.  I will discharge him on his prednisone 10 mg daily.  I have not given him any antibiotics on discharge though he received Z-Pak at the time of admission.  Continue his nebulizer and inhaler albuterol, montelukast, Symbicort as he is taking at home follow-up with his family physician.  #2 essential hypertension continue home meds Norvasc, amlodipine, HCTZ losartan.  #3 bilateral lower extremity edema he reports it is chronic the left lower extremity edema is more than the right lower extremity.  I have ordered a venous Doppler which is pending at this time.    Estimated body mass index is 30.75 kg/m  as calculated from the following:   Height as of this encounter: 5\' 11"  (1.803 m).   Weight as of this encounter: 100 kg.  Discharge Instructions  Discharge Instructions    Call MD for:  difficulty breathing, headache or visual disturbances   Complete by: As directed    Call MD for:  persistant nausea and vomiting   Complete by: As directed    Diet -  low sodium heart healthy   Complete by: As directed    Increase activity slowly   Complete by: As directed      Allergies as of 02/01/2020   No Known Allergies     Medication List    STOP taking these medications   fluticasone furoate-vilanterol 200-25 MCG/INH Aepb Commonly known as: BREO ELLIPTA   naproxen sodium 220 MG tablet Commonly known as: ALEVE     TAKE these medications   acetaminophen 325 MG tablet Commonly known as: TYLENOL Take 2 tablets (650 mg total) by mouth every 6 (six) hours as needed for mild pain (or Fever >/= 101).   Advair Diskus 250-50 MCG/DOSE Aepb Generic drug: Fluticasone-Salmeterol Inhale 1 puff into the lungs daily.   albuterol 108 (90 Base) MCG/ACT inhaler Commonly known as: VENTOLIN HFA Inhale into the lungs every 6 (six) hours as needed for wheezing or shortness of breath.   amLODipine 2.5 MG tablet Commonly known as: NORVASC Take 2.5 mg by mouth daily.   aspirin 81 MG tablet Take 81 mg by mouth daily.   doxazosin 8 MG tablet Commonly known as: CARDURA Take 8 mg by mouth every evening.   finasteride 5 MG tablet Commonly known as: PROSCAR Take 5 mg by mouth every evening.   GARLIC PO Take 1 capsule by mouth daily.   losartan-hydrochlorothiazide 100-12.5 MG tablet Commonly known as: HYZAAR Take 1 tablet by mouth daily. Please start taking it on 2/15 What changed: additional instructions   montelukast 10 MG tablet Commonly known as: SINGULAIR Take 10 mg by mouth daily.   MULTIVITAMIN ADULT PO Take 1 tablet by mouth daily.   predniSONE 10 MG tablet Commonly known as: DELTASONE Take 1 tablet (10 mg total) by mouth daily.   Symbicort 160-4.5 MCG/ACT inhaler Generic drug: budesonide-formoterol Inhale 2 puffs into the lungs daily.      Follow-up Information    3/15, MD Follow up.   Specialty: Pulmonary Disease Contact information: [address] [place] [phone]           No Known Allergies  Consultations: none  Procedures/Studies: DG Chest Port 1 View  Result Date: 01/31/2020 CLINICAL DATA:  Shortness of breath EXAM: PORTABLE CHEST 1 VIEW COMPARISON:  10/12/2014 FINDINGS: Heart is normal size. Mild peribronchial thickening and perihilar interstitial prominence. No confluent opacities or effusions. No acute bony abnormality. IMPRESSION: Peribronchial thickening and perihilar interstitial prominence could reflect bronchitic changes, less likely edema. Electronically Signed   By: 10/14/2014 M.D.   On: 01/31/2020 18:10    (Echo, Carotid, EGD, Colonoscopy, ERCP)    Subjective: Patient sitting up by the side of the bed in no acute distress no respiratory distress answers my questions appropriately speaking in full sentences  Discharge Exam: Vitals:   02/01/20 0601 02/01/20 0947  BP: (!) 123/51 135/90  Pulse: (!) 103 (!) 102  Resp: 18 20  Temp: 97.7 F (36.5 C) 97.9 F (36.6 C)  SpO2: 99% 94%   Vitals:   01/31/20 2200 01/31/20 2306 02/01/20 0601 02/01/20 0947  BP: 03/31/20)  161/84  (!) 123/51 135/90  Pulse: 99  (!) 103 (!) 102  Resp: 19  18 20   Temp: 98.4 F (36.9 C)  97.7 F (36.5 C) 97.9 F (36.6 C)  TempSrc:   Oral Oral  SpO2: 94%  99% 94%  Weight:  100 kg    Height:  5\' 11"  (1.803 m)      General: Pt is alert, awake, not in acute distress Cardiovascular: RRR, S1/S2 +, no rubs, no gallops Respiratory: Scattered wheezing bilaterally, no wheezing, no rhonchi Abdominal: Soft, NT, ND, bowel sounds + Extremities: no edema, no cyanosis    The results of significant diagnostics from this hospitalization (including imaging, microbiology, ancillary and laboratory) are listed below for reference.     Microbiology: Recent Results (from the past 240 hour(s))  Respiratory Panel by RT PCR (Flu A&B, Covid) - Nasopharyngeal Swab     Status: None   Collection Time: 01/31/20  8:06 PM   Specimen: Nasopharyngeal Swab  Result Value Ref Range Status    SARS Coronavirus 2 by RT PCR NEGATIVE NEGATIVE Final    Comment: (NOTE) SARS-CoV-2 target nucleic acids are NOT DETECTED. The SARS-CoV-2 RNA is generally detectable in upper respiratoy specimens during the acute phase of infection. The lowest concentration of SARS-CoV-2 viral copies this assay can detect is 131 copies/mL. A negative result does not preclude SARS-Cov-2 infection and should not be used as the sole basis for treatment or other patient management decisions. A negative result may occur with  improper specimen collection/handling, submission of specimen other than nasopharyngeal swab, presence of viral mutation(s) within the areas targeted by this assay, and inadequate number of viral copies (<131 copies/mL). A negative result must be combined with clinical observations, patient history, and epidemiological information. The expected result is Negative. Fact Sheet for Patients:  https://www.moore.com/ Fact Sheet for Healthcare Providers:  https://www.young.biz/ This test is not yet ap proved or cleared by the [place] FDA and  has been authorized for detection and/or diagnosis of SARS-CoV-2 by FDA under an Emergency Use Authorization (EUA). This EUA will remain  in effect (meaning this test can be used) for the duration of the COVID-19 declaration under Section 564(b)(1) of the Act, 21 U.S.C. section 360bbb-3(b)(1), unless the authorization is terminated or revoked sooner.    Influenza A by PCR NEGATIVE NEGATIVE Final   Influenza B by PCR NEGATIVE NEGATIVE Final    Comment: (NOTE) The Xpert Xpress SARS-CoV-2/FLU/RSV assay is intended as an aid in  the diagnosis of influenza from Nasopharyngeal swab specimens and  should not be used as a sole basis for treatment. Nasal washings and  aspirates are unacceptable for Xpert Xpress SARS-CoV-2/FLU/RSV  testing. Fact Sheet for Patients: https://www.moore.com/ Fact  Sheet for Healthcare Providers: https://www.young.biz/ This test is not yet approved or cleared by the [place] FDA and  has been authorized for detection and/or diagnosis of SARS-CoV-2 by  FDA under an Emergency Use Authorization (EUA). This EUA will remain  in effect (meaning this test can be used) for the duration of the  Covid-19 declaration under Section 564(b)(1) of the Act, 21  U.S.C. section 360bbb-3(b)(1), unless the authorization is  terminated or revoked. Performed at [facility], [address], [place], [place] [address]      Labs: BNP (last 3 results) Recent Labs    01/31/20 1733  BNP 28.8   Basic Metabolic Panel: Recent Labs  Lab 01/31/20 1730 02/01/20 0422  NA 138 142  K 3.7 5.0  CL  103 106  CO2 25 28  GLUCOSE 162* 127*  BUN 26* 25*  CREATININE 1.08 1.02  CALCIUM 8.9 9.1   Liver Function Tests: Recent Labs  Lab 01/31/20 1730 02/01/20 0422  AST 16 16  ALT 20 18  ALKPHOS 67 57  BILITOT 0.4 0.4  PROT 7.3 7.0  ALBUMIN 3.7 3.5   No results for input(s): LIPASE, AMYLASE in the last 168 hours. No results for input(s): AMMONIA in the last 168 hours. CBC: Recent Labs  Lab 01/31/20 1730 02/01/20 0422  WBC 11.8* 11.8*  NEUTROABS 10.4*  --   HGB 11.6* 11.3*  HCT 37.8* 37.5*  MCV 86.1 85.6  PLT 250 242   Cardiac Enzymes: No results for input(s): CKTOTAL, CKMB, CKMBINDEX, TROPONINI in the last 168 hours. BNP: Invalid input(s): POCBNP CBG: No results for input(s): GLUCAP in the last 168 hours. D-Dimer No results for input(s): DDIMER in the last 72 hours. Hgb A1c No results for input(s): HGBA1C in the last 72 hours. Lipid Profile No results for input(s): CHOL, HDL, LDLCALC, TRIG, CHOLHDL, LDLDIRECT in the last 72 hours. Thyroid function studies No results for input(s): TSH, T4TOTAL, T3FREE, THYROIDAB in the last 72 hours.  Invalid input(s): FREET3 Anemia work up No results for input(s):  VITAMINB12, FOLATE, FERRITIN, TIBC, IRON, RETICCTPCT in the last 72 hours. Urinalysis No results found for: COLORURINE, APPEARANCEUR, LABSPEC, PHURINE, GLUCOSEU, HGBUR, BILIRUBINUR, KETONESUR, PROTEINUR, UROBILINOGEN, NITRITE, LEUKOCYTESUR Sepsis Labs Invalid input(s): PROCALCITONIN,  WBC,  LACTICIDVEN Microbiology Recent Results (from the past 240 hour(s))  Respiratory Panel by RT PCR (Flu A&B, Covid) - Nasopharyngeal Swab     Status: None   Collection Time: 01/31/20  8:06 PM   Specimen: Nasopharyngeal Swab  Result Value Ref Range Status   SARS Coronavirus 2 by RT PCR NEGATIVE NEGATIVE Final    Comment: (NOTE) SARS-CoV-2 target nucleic acids are NOT DETECTED. The SARS-CoV-2 RNA is generally detectable in upper respiratoy specimens during the acute phase of infection. The lowest concentration of SARS-CoV-2 viral copies this assay can detect is 131 copies/mL. A negative result does not preclude SARS-Cov-2 infection and should not be used as the sole basis for treatment or other patient management decisions. A negative result may occur with  improper specimen collection/handling, submission of specimen other than nasopharyngeal swab, presence of viral mutation(s) within the areas targeted by this assay, and inadequate number of viral copies (<131 copies/mL). A negative result must be combined with clinical observations, patient history, and epidemiological information. The expected result is Negative. Fact Sheet for Patients:  https://www.moore.com/ Fact Sheet for Healthcare Providers:  https://www.young.biz/ This test is not yet ap proved or cleared by the [place] FDA and  has been authorized for detection and/or diagnosis of SARS-CoV-2 by FDA under an Emergency Use Authorization (EUA). This EUA will remain  in effect (meaning this test can be used) for the duration of the COVID-19 declaration under Section 564(b)(1) of the Act, 21  U.S.C. section 360bbb-3(b)(1), unless the authorization is terminated or revoked sooner.    Influenza A by PCR NEGATIVE NEGATIVE Final   Influenza B by PCR NEGATIVE NEGATIVE Final    Comment: (NOTE) The Xpert Xpress SARS-CoV-2/FLU/RSV assay is intended as an aid in  the diagnosis of influenza from Nasopharyngeal swab specimens and  should not be used as a sole basis for treatment. Nasal washings and  aspirates are unacceptable for Xpert Xpress SARS-CoV-2/FLU/RSV  testing. Fact Sheet for Patients: https://www.moore.com/ Fact Sheet for Healthcare Providers: https://www.young.biz/ This test is not  yet approved or cleared by the [place] and  has been authorized for detection and/or diagnosis of SARS-CoV-2 by  FDA under an Emergency Use Authorization (EUA). This EUA will remain  in effect (meaning this test can be used) for the duration of the  Covid-19 declaration under Section 564(b)(1) of the Act, 21  U.S.C. section 360bbb-3(b)(1), unless the authorization is  terminated or revoked. Performed at [facility], [address] [place], [place] [address]      Time coordinating discharge:  38 minutes  SIGNED:   Alwyn Ren, MD  [facility] 02/01/2020, 1:15 PM Pager   If 7PM-7AM, please contact night-coverage [email or web address] Password [email or web address]

## 2020-02-01 NOTE — Progress Notes (Signed)
Patient being discharged home via private vehicle with wife. Patient is in stable condition.

## 2020-02-01 NOTE — Progress Notes (Signed)
VASCULAR LAB PRELIMINARY  PRELIMINARY  PRELIMINARY  PRELIMINARY  Bilateral lower extremity venous duplex completed.    Preliminary report:  See CV proc for preliminary results.   Shakyra Mattera, RVT 02/01/2020, 3:06 PM

## 2020-02-27 ENCOUNTER — Ambulatory Visit: Payer: Managed Care, Other (non HMO) | Admitting: Podiatry

## 2020-02-27 ENCOUNTER — Other Ambulatory Visit: Payer: Self-pay

## 2020-02-27 ENCOUNTER — Encounter: Payer: Self-pay | Admitting: Podiatry

## 2020-02-27 VITALS — Temp 98.8°F

## 2020-02-27 DIAGNOSIS — I872 Venous insufficiency (chronic) (peripheral): Secondary | ICD-10-CM | POA: Diagnosis not present

## 2020-02-27 DIAGNOSIS — L84 Corns and callosities: Secondary | ICD-10-CM | POA: Diagnosis not present

## 2020-02-27 DIAGNOSIS — M79676 Pain in unspecified toe(s): Secondary | ICD-10-CM

## 2020-02-27 DIAGNOSIS — B351 Tinea unguium: Secondary | ICD-10-CM

## 2020-02-27 NOTE — Progress Notes (Signed)
Complaint:  Visit Type: Patient returns to my office for continued preventative foot care services. Complaint: Patient states" my nails have grown long and thick and become painful to walk and wear shoes"  The patient presents for preventative foot care services. No changes to ROS.  Patient says he has darkened skin above his left ankle.  No pain or drainage noted.   Podiatric Exam: Vascular: dorsalis pedis and posterior tibial pulses are palpable bilateral. Capillary return is immediate. Temperature gradient is WNL. Skin turgor WNL  Venous stasis dermatitis above left ankle. Sensorium: Normal Semmes Weinstein monofilament test. Normal tactile sensation bilaterally. Nail Exam: Pt has thick disfigured discolored nails with subungual debris noted bilateral entire nail hallux through fifth toenails Ulcer Exam: There is no evidence of ulcer or pre-ulcerative changes or infection. Orthopedic Exam: Muscle tone and strength are WNL. No limitations in general ROM. No crepitus or effusions noted. Foot type and digits show no abnormalities. HAV  B/L. Contracted 4,5 toes right foot..  Pes planus  B/L. Skin: No Porokeratosis. No infection or ulcers.  Callus right foot.  Diagnosis:  Onychomycosis, , Pain in right toe, pain in left toes.  Callus right foot.  Venous stasis dermatitis left leg.  Treatment & Plan Procedures and Treatment: Consent by patient was obtained for treatment procedures.   Debridement of mycotic and hypertrophic toenails, 1 through 5 bilateral and clearing of subungual debris. No ulceration, no infection noted.  Return Visit-Office Procedure: Patient instructed to return to the office for a follow up visit 10 weeks  for continued evaluation and treatment.    Helane Gunther DPM

## 2020-05-28 ENCOUNTER — Ambulatory Visit: Payer: Managed Care, Other (non HMO) | Admitting: Podiatry

## 2020-05-28 ENCOUNTER — Other Ambulatory Visit: Payer: Self-pay

## 2020-05-28 ENCOUNTER — Encounter: Payer: Self-pay | Admitting: Podiatry

## 2020-05-28 DIAGNOSIS — B351 Tinea unguium: Secondary | ICD-10-CM

## 2020-05-28 DIAGNOSIS — M79676 Pain in unspecified toe(s): Secondary | ICD-10-CM | POA: Diagnosis not present

## 2020-05-28 DIAGNOSIS — M21619 Bunion of unspecified foot: Secondary | ICD-10-CM | POA: Diagnosis not present

## 2020-05-28 DIAGNOSIS — L84 Corns and callosities: Secondary | ICD-10-CM | POA: Insufficient documentation

## 2020-05-28 NOTE — Progress Notes (Signed)
Complaint:  Visit Type: Patient returns to my office for continued preventative foot care services. Complaint: Patient states" my nails have grown long and thick and become painful to walk and wear shoes" Patient says the callus on his feet are painful walking and weaing his shoes. The patient presents for preventative foot care services. No changes to ROS.  Patient says he has continues to have  darkened skin above his left ankle and similar skin lesions on the top of his left foot.Marland Kitchen  No pain or drainage noted.   Podiatric Exam: Vascular: dorsalis pedis and posterior tibial pulses are palpable bilateral. Capillary return is immediate. Temperature gradient is WNL. Skin turgor WNL   Sensorium: Normal Semmes Weinstein monofilament test. Normal tactile sensation bilaterally. Nail Exam: Pt has thick disfigured discolored nails with subungual debris noted bilateral entire nail hallux through fifth toenails Ulcer Exam: There is no evidence of ulcer or pre-ulcerative changes or infection. Orthopedic Exam: Muscle tone and strength are WNL. No limitations in general ROM. No crepitus or effusions noted. Foot type and digits show no abnormalities. HAV  B/L. Contracted 4,5 toes right foot..  Pes planus  B/L. Skin: No Porokeratosis. No infection or ulcers.  Callus right foot. Multiple skin lesions on dorsum of left foot with purplish discoloration.and crust in the lesions.  No pain or drainage.  Diagnosis:  Onychomycosis, , Pain in right toe, pain in left toes.  Callus right foot.  Multiple skin lesions left foot. Treatment & Plan Procedures and Treatment: Consent by patient was obtained for treatment procedures.   Debridement of mycotic and hypertrophic toenails, 1 through 5 bilateral and clearing of subungual debris with nail nipper and usage of dremel tool.  Debride callus with # 15 blade.. No ulceration, no infection noted.  Return Visit-Office Procedure: Patient instructed to return to the office for a follow  up visit 10 weeks  for continued evaluation and treatment.    Helane Gunther DPM

## 2020-08-28 ENCOUNTER — Ambulatory Visit: Payer: Managed Care, Other (non HMO) | Admitting: Podiatry

## 2020-12-06 ENCOUNTER — Other Ambulatory Visit: Payer: Self-pay

## 2020-12-06 ENCOUNTER — Inpatient Hospital Stay (HOSPITAL_COMMUNITY)
Admission: EM | Admit: 2020-12-06 | Discharge: 2020-12-09 | DRG: 177 | Disposition: A | Discharge status: Home / Self Care | Payer: Managed Care, Other (non HMO) | Attending: Internal Medicine | Admitting: Internal Medicine

## 2020-12-06 ENCOUNTER — Emergency Department (HOSPITAL_COMMUNITY): Payer: Managed Care, Other (non HMO)

## 2020-12-06 ENCOUNTER — Encounter (HOSPITAL_COMMUNITY): Payer: Self-pay

## 2020-12-06 DIAGNOSIS — R0902 Hypoxemia: Secondary | ICD-10-CM | POA: Diagnosis present

## 2020-12-06 DIAGNOSIS — J4521 Mild intermittent asthma with (acute) exacerbation: Secondary | ICD-10-CM | POA: Diagnosis present

## 2020-12-06 DIAGNOSIS — I10 Essential (primary) hypertension: Secondary | ICD-10-CM | POA: Diagnosis not present

## 2020-12-06 DIAGNOSIS — U071 COVID-19: Secondary | ICD-10-CM | POA: Diagnosis not present

## 2020-12-06 DIAGNOSIS — Z8249 Family history of ischemic heart disease and other diseases of the circulatory system: Secondary | ICD-10-CM

## 2020-12-06 DIAGNOSIS — Z79899 Other long term (current) drug therapy: Secondary | ICD-10-CM

## 2020-12-06 DIAGNOSIS — Z6841 Body Mass Index (BMI) 40.0 and over, adult: Secondary | ICD-10-CM

## 2020-12-06 DIAGNOSIS — Z7952 Long term (current) use of systemic steroids: Secondary | ICD-10-CM

## 2020-12-06 DIAGNOSIS — N179 Acute kidney failure, unspecified: Secondary | ICD-10-CM | POA: Diagnosis present

## 2020-12-06 DIAGNOSIS — N400 Enlarged prostate without lower urinary tract symptoms: Secondary | ICD-10-CM | POA: Diagnosis not present

## 2020-12-06 DIAGNOSIS — Z7951 Long term (current) use of inhaled steroids: Secondary | ICD-10-CM

## 2020-12-06 DIAGNOSIS — E860 Dehydration: Secondary | ICD-10-CM | POA: Diagnosis present

## 2020-12-06 DIAGNOSIS — Z7982 Long term (current) use of aspirin: Secondary | ICD-10-CM

## 2020-12-06 DIAGNOSIS — J1282 Pneumonia due to coronavirus disease 2019: Secondary | ICD-10-CM | POA: Diagnosis present

## 2020-12-06 LAB — COMPREHENSIVE METABOLIC PANEL
ALT: 26 U/L (ref 0–44)
AST: 35 U/L (ref 15–41)
Albumin: 3.3 g/dL — ABNORMAL LOW (ref 3.5–5.0)
Alkaline Phosphatase: 48 U/L (ref 38–126)
Anion gap: 13 (ref 5–15)
BUN: 50 mg/dL — ABNORMAL HIGH (ref 8–23)
CO2: 21 mmol/L — ABNORMAL LOW (ref 22–32)
Calcium: 8.3 mg/dL — ABNORMAL LOW (ref 8.9–10.3)
Chloride: 101 mmol/L (ref 98–111)
Creatinine, Ser: 1.85 mg/dL — ABNORMAL HIGH (ref 0.61–1.24)
GFR, Estimated: 38 mL/min — ABNORMAL LOW (ref >60)
Glucose, Bld: 109 mg/dL — ABNORMAL HIGH (ref 70–99)
Potassium: 4.9 mmol/L (ref 3.5–5.1)
Sodium: 135 mmol/L (ref 135–145)
Total Bilirubin: 0.5 mg/dL (ref 0.3–1.2)
Total Protein: 7.1 g/dL (ref 6.5–8.1)

## 2020-12-06 LAB — RESP PANEL BY RT-PCR (FLU A&B, COVID) ARPGX2
Influenza A by PCR: NEGATIVE
Influenza B by PCR: NEGATIVE
SARS Coronavirus 2 by RT PCR: POSITIVE — AB

## 2020-12-06 LAB — CBC WITH DIFFERENTIAL/PLATELET
Abs Immature Granulocytes: 0.07 10*3/uL (ref 0.00–0.07)
Basophils Absolute: 0 10*3/uL (ref 0.0–0.1)
Basophils Relative: 0 %
Eosinophils Absolute: 0 10*3/uL (ref 0.0–0.5)
Eosinophils Relative: 0 %
HCT: 38.9 % — ABNORMAL LOW (ref 39.0–52.0)
Hemoglobin: 12.3 g/dL — ABNORMAL LOW (ref 13.0–17.0)
Immature Granulocytes: 1 %
Lymphocytes Relative: 11 %
Lymphs Abs: 0.7 10*3/uL (ref 0.7–4.0)
MCH: 25.7 pg — ABNORMAL LOW (ref 26.0–34.0)
MCHC: 31.6 g/dL (ref 30.0–36.0)
MCV: 81.4 fL (ref 80.0–100.0)
Monocytes Absolute: 0.7 10*3/uL (ref 0.1–1.0)
Monocytes Relative: 11 %
Neutro Abs: 5 10*3/uL (ref 1.7–7.7)
Neutrophils Relative %: 77 %
Platelets: 226 10*3/uL (ref 150–400)
RBC: 4.78 MIL/uL (ref 4.22–5.81)
RDW: 15.5 % (ref 11.5–15.5)
WBC: 6.4 10*3/uL (ref 4.0–10.5)
nRBC: 0 % (ref 0.0–0.2)

## 2020-12-06 MED ORDER — SODIUM CHLORIDE 0.9 % IV SOLN
100.0000 mg | Freq: Every day | INTRAVENOUS | Status: DC
  Administered 2020-12-07 – 2020-12-09 (×3): 100 mg via INTRAVENOUS
  Filled 2020-12-06 (×3): qty 20

## 2020-12-06 MED ORDER — ACETAMINOPHEN 325 MG PO TABS: 650.0000 mg | ORAL_TABLET | Freq: Four times a day (QID) | ORAL | Status: DC | PRN

## 2020-12-06 MED ORDER — ASPIRIN 81 MG PO CHEW
81.0000 mg | CHEWABLE_TABLET | Freq: Every day | ORAL | Status: DC
  Administered 2020-12-07 – 2020-12-09 (×3): 81 mg via ORAL
  Filled 2020-12-06 (×3): qty 1

## 2020-12-06 MED ORDER — FINASTERIDE 5 MG PO TABS
5.0000 mg | ORAL_TABLET | Freq: Every evening | ORAL | Status: DC
  Administered 2020-12-06 – 2020-12-08 (×3): 5 mg via ORAL
  Filled 2020-12-06 (×3): qty 1

## 2020-12-06 MED ORDER — MONTELUKAST SODIUM 10 MG PO TABS
10.0000 mg | ORAL_TABLET | Freq: Every day | ORAL | Status: DC
  Administered 2020-12-07 – 2020-12-09 (×3): 10 mg via ORAL
  Filled 2020-12-06 (×4): qty 1

## 2020-12-06 MED ORDER — AMLODIPINE BESYLATE 5 MG PO TABS
2.5000 mg | ORAL_TABLET | Freq: Every day | ORAL | Status: DC
  Administered 2020-12-07 – 2020-12-09 (×3): 2.5 mg via ORAL
  Filled 2020-12-06 (×3): qty 1

## 2020-12-06 MED ORDER — ALBUTEROL SULFATE HFA 108 (90 BASE) MCG/ACT IN AERS: 2.0000 | INHALATION_SPRAY | RESPIRATORY_TRACT | Status: DC | PRN

## 2020-12-06 MED ORDER — ALBUTEROL SULFATE HFA 108 (90 BASE) MCG/ACT IN AERS
2.0000 | INHALATION_SPRAY | Freq: Once | RESPIRATORY_TRACT | Status: AC
  Administered 2020-12-06: 19:00:00 2 via RESPIRATORY_TRACT
  Filled 2020-12-06: qty 6.7

## 2020-12-06 MED ORDER — ZINC SULFATE 220 (50 ZN) MG PO CAPS
220.0000 mg | ORAL_CAPSULE | Freq: Every day | ORAL | Status: DC
  Administered 2020-12-07 – 2020-12-09 (×3): 220 mg via ORAL
  Filled 2020-12-06 (×3): qty 1

## 2020-12-06 MED ORDER — ASCORBIC ACID 500 MG PO TABS
500.0000 mg | ORAL_TABLET | Freq: Every day | ORAL | Status: DC
  Administered 2020-12-07 – 2020-12-09 (×3): 500 mg via ORAL
  Filled 2020-12-06 (×3): qty 1

## 2020-12-06 MED ORDER — METHYLPREDNISOLONE SODIUM SUCC 125 MG IJ SOLR
60.0000 mg | Freq: Two times a day (BID) | INTRAMUSCULAR | Status: DC
  Administered 2020-12-07 – 2020-12-09 (×5): 60 mg via INTRAVENOUS
  Filled 2020-12-06 (×5): qty 2

## 2020-12-06 MED ORDER — POLYETHYLENE GLYCOL 3350 17 G PO PACK
17.0000 g | PACK | Freq: Every day | ORAL | Status: DC | PRN
Start: 2020-12-06 — End: 2020-12-09

## 2020-12-06 MED ORDER — SODIUM CHLORIDE 0.9 % IV SOLN
200.0000 mg | Freq: Once | INTRAVENOUS | Status: AC
  Administered 2020-12-06: 200 mg via INTRAVENOUS
  Filled 2020-12-06: qty 200

## 2020-12-06 MED ORDER — ONDANSETRON HCL 4 MG/2ML IJ SOLN: 4.0000 mg | Freq: Four times a day (QID) | INTRAMUSCULAR | Status: DC | PRN

## 2020-12-06 MED ORDER — DEXAMETHASONE SODIUM PHOSPHATE 10 MG/ML IJ SOLN
10.0000 mg | Freq: Once | INTRAMUSCULAR | Status: AC
  Administered 2020-12-06: 22:00:00 10 mg via INTRAVENOUS
  Filled 2020-12-06: qty 1

## 2020-12-06 MED ORDER — LACTATED RINGERS IV SOLN: INTRAVENOUS | Status: AC

## 2020-12-06 MED ORDER — ALBUTEROL SULFATE HFA 108 (90 BASE) MCG/ACT IN AERS
2.0000 | INHALATION_SPRAY | Freq: Four times a day (QID) | RESPIRATORY_TRACT | Status: DC
  Administered 2020-12-07 – 2020-12-09 (×10): 2 via RESPIRATORY_TRACT
  Filled 2020-12-06: qty 6.7

## 2020-12-06 MED ORDER — DOXAZOSIN MESYLATE 2 MG PO TABS
8.0000 mg | ORAL_TABLET | Freq: Every evening | ORAL | Status: DC
  Administered 2020-12-06 – 2020-12-08 (×3): 8 mg via ORAL
  Filled 2020-12-06 (×2): qty 4
  Filled 2020-12-06: qty 1

## 2020-12-06 MED ORDER — ENOXAPARIN SODIUM 40 MG/0.4ML ~~LOC~~ SOLN
40.0000 mg | SUBCUTANEOUS | Status: DC
  Administered 2020-12-07: 10:00:00 40 mg via SUBCUTANEOUS
  Filled 2020-12-06: qty 0.4

## 2020-12-06 MED ORDER — GUAIFENESIN-DM 100-10 MG/5ML PO SYRP: 10.0000 mL | ORAL_SOLUTION | ORAL | Status: DC | PRN

## 2020-12-06 MED ORDER — ONDANSETRON HCL 4 MG PO TABS: 4.0000 mg | ORAL_TABLET | Freq: Four times a day (QID) | ORAL | Status: DC | PRN

## 2020-12-06 MED ORDER — SODIUM CHLORIDE 0.9 % IV BOLUS
1000.0000 mL | Freq: Once | INTRAVENOUS | Status: AC
  Administered 2020-12-06: 1000 mL via INTRAVENOUS

## 2020-12-06 NOTE — ED Notes (Signed)
Checked pt temp 99.7 rectal

## 2020-12-06 NOTE — ED Notes (Signed)
Ambulated patient around room. SpO2 stayed at 93%, respirations around 37. MD notified

## 2020-12-06 NOTE — ED Provider Notes (Signed)
[facility]-EMERGENCY DEPT Provider Note   CSN: [id number] Arrival date & time: 12/06/20  1734     History Chief Complaint  Patient presents with  . Covid Positive  . Fever  . Chills  . Generalized Body Aches    ARTYOM STENCEL is a 74 y.o. male.  Patient complains of diarrhea for a week cough and shortness of breath mild fever  The history is provided by the patient. No language interpreter was used.  Shortness of Breath Severity:  Moderate Onset quality:  Gradual Timing:  Constant Progression:  Worsening Chronicity:  Recurrent Context: activity   Relieved by:  Nothing Worsened by:  Nothing Ineffective treatments:  None tried Associated symptoms: no abdominal pain, no chest pain, no cough, no headaches and no rash        Past Medical History:  Diagnosis Date  . Asthma   . Hypertension   . Seasonal allergies     Patient Active Problem List   Diagnosis Date Noted  . COVID-19 virus infection 12/06/2020  . Callus 05/28/2020  . Exacerbation of intermittent asthma   . Acute asthma exacerbation 01/31/2020  . Hyperglycemia 01/31/2020  . Tachycardia 01/31/2020  . Chronic obstructive pulmonary disease (HCC) 06/26/2016  . Occupational exposure to air contaminants 04/23/2016  . History of chronic obstructive airway disease 04/23/2016  . Accelerated hypertension 11/07/2013  . Asthma, chronic 11/07/2013  . BPH (benign prostatic hypertrophy) 11/07/2013    History reviewed. No pertinent surgical history.     Family History  Problem Relation Age of Onset  . Heart attack Mother     Social History   Tobacco Use  . Smoking status: Never Smoker  . Smokeless tobacco: Never Used  Substance Use Topics  . Alcohol use: No    Alcohol/week: 0.0 standard drinks  . Drug use: No    Home Medications Prior to Admission medications   Medication Sig Start Date End Date Taking? Authorizing Provider  acetaminophen (TYLENOL) 325 MG tablet Take 2 tablets  (650 mg total) by mouth every 6 (six) hours as needed for mild pain (or Fever >/= 101). 02/01/20   Alwyn Ren, MD  ADVAIR DISKUS 250-50 MCG/DOSE AEPB Inhale 1 puff into the lungs daily.  01/22/20   [provider]  albuterol (PROVENTIL) 4 MG tablet Take 4 mg by mouth 2 (two) times daily. 04/09/20   [provider]  amLODipine (NORVASC) 2.5 MG tablet Take 2.5 mg by mouth daily. 10/10/18   [provider]  aspirin 81 MG tablet Take 81 mg by mouth daily.    [provider]  doxazosin (CARDURA) 8 MG tablet Take 8 mg by mouth every evening.  01/04/19   [provider]  finasteride (PROSCAR) 5 MG tablet Take 5 mg by mouth every evening.     [provider]  GARLIC PO Take 1 capsule by mouth daily.    [provider]  ipratropium (ATROVENT) 0.02 % nebulizer solution Take by nebulization every 6 (six) hours. 02/13/20   [provider]  losartan-hydrochlorothiazide (HYZAAR) 100-12.5 MG tablet Take 1 tablet by mouth daily. Please start taking it on 2/15 02/01/20   Alwyn Ren, MD  montelukast (SINGULAIR) 10 MG tablet Take 10 mg by mouth daily.     [provider]  Multiple Vitamin (MULTIVITAMIN ADULT PO) Take 1 tablet by mouth daily.    [provider]  predniSONE (DELTASONE) 10 MG tablet Take 1 tablet (10 mg total) by mouth daily. 02/01/20  Alwyn Ren, MD  SYMBICORT 160-4.5 MCG/ACT inhaler Inhale 2 puffs into the lungs daily.  10/10/18   [provider]  triamcinolone ointment (KENALOG) 0.1 % APPLY OINTMENT TOPICALLY TO AFFECTED AREA TWICE DAILY 03/31/20   [provider]    Allergies    Patient has no known allergies.  Review of Systems   Review of Systems  Constitutional: Negative for appetite change and fatigue.  HENT: Negative for congestion, ear discharge and sinus pressure.   Eyes: Negative for discharge.  Respiratory: Positive for shortness of breath. Negative for  cough.   Cardiovascular: Negative for chest pain.  Gastrointestinal: Negative for abdominal pain and diarrhea.       Diarrhea  Genitourinary: Negative for frequency and hematuria.  Musculoskeletal: Negative for back pain.  Skin: Negative for rash.  Neurological: Negative for seizures and headaches.  Psychiatric/Behavioral: Negative for hallucinations.    Physical Exam Updated Vital Signs BP 135/72   Pulse 99   Temp 99.7 F (37.6 C) (Rectal)   Resp (!) 26   Ht 5\' 2"  (1.575 m)   Wt 99.8 kg   SpO2 95%   BMI 40.24 kg/m   Physical Exam Vitals reviewed.  Constitutional:      Appearance: He is well-developed.  HENT:     Head: Normocephalic.     Nose: Nose normal.  Eyes:     General: No scleral icterus.    Extraocular Movements: EOM normal.     Conjunctiva/sclera: Conjunctivae normal.  Neck:     Thyroid: No thyromegaly.  Cardiovascular:     Rate and Rhythm: Normal rate and regular rhythm.     Heart sounds: No murmur heard. No friction rub. No gallop.   Pulmonary:     Breath sounds: No stridor. Wheezing present. No rales.     Comments: Tachypnea Chest:     Chest wall: No tenderness.  Abdominal:     General: There is no distension.     Tenderness: There is no abdominal tenderness. There is no rebound.  Musculoskeletal:        General: No edema. Normal range of motion.     Cervical back: Neck supple.  Lymphadenopathy:     Cervical: No cervical adenopathy.  Skin:    Findings: No erythema or rash.  Neurological:     Mental Status: He is alert and oriented to person, place, and time.     Motor: No abnormal muscle tone.     Coordination: Coordination normal.  Psychiatric:        Mood and Affect: Mood and affect normal.        Behavior: Behavior normal.     ED Results / Procedures / Treatments   Labs (all labs ordered are listed, but only abnormal results are displayed) Labs Reviewed  RESP PANEL BY RT-PCR (FLU A&B, COVID) ARPGX2 - Abnormal; Notable for the  following components:      Result Value   SARS Coronavirus 2 by RT PCR POSITIVE (*)    All other components within normal limits  CBC WITH DIFFERENTIAL/PLATELET - Abnormal; Notable for the following components:   Hemoglobin 12.3 (*)    HCT 38.9 (*)    MCH 25.7 (*)    All other components within normal limits  COMPREHENSIVE METABOLIC PANEL - Abnormal; Notable for the following components:   CO2 21 (*)    Glucose, Bld 109 (*)    BUN 50 (*)    Creatinine, Ser 1.85 (*)    Calcium 8.3 (*)  Albumin 3.3 (*)    GFR, Estimated 38 (*)    All other components within normal limits    EKG None  Radiology DG Chest Port 1 View  Result Date: 12/06/2020 CLINICAL DATA:  Short of breath, COVID EXAM: PORTABLE CHEST 1 VIEW COMPARISON:  01/31/2020 FINDINGS: Patchy and streaky perihilar and basilar opacities. Stable cardiomediastinal silhouette with aortic atherosclerosis. No pneumothorax IMPRESSION: Minimal patchy and streaky perihilar and basilar opacities, probable areas of pneumonia. Electronically Signed   By: Jasmine Pang M.D.   On: 12/06/2020 18:31    Procedures Procedures (including critical care time)  Medications Ordered in ED Medications  dexamethasone (DECADRON) injection 10 mg (has no administration in time range)  sodium chloride 0.9 % bolus 1,000 mL (1,000 mLs Intravenous New Bag/Given 12/06/20 1848)  albuterol (VENTOLIN HFA) 108 (90 Base) MCG/ACT inhaler 2 puff (2 puffs Inhalation Given 12/06/20 1841)    ED Course  I have reviewed the triage vital signs and the nursing notes.  Pertinent labs & imaging results that were available during my care of the patient were reviewed by me and considered in my medical decision making (see chart for details).    MDM Rules/Calculators/A&P                          Patient with Covid and tachypnea.  Also AKI.  He will be admitted to medicine Final Clinical Impression(s) / ED Diagnoses Final diagnoses:  AKI (acute kidney injury) (HCC)   COVID    Rx / DC Orders ED Discharge Orders    None       Bethann Berkshire, MD 12/06/20 2136

## 2020-12-06 NOTE — ED Triage Notes (Signed)
Pt BIB EMS. Pt was being seen at his PCP when he tested positive for covid. PCP stated pt bp was 90/45 and O2 was 80% RA. Per EMS pt has been having body aches, chills and fever for a few days; pt wife has covid. PCP advised pt to come to ER.   BP-109/45 O2-93%RA; 97% 2LNC HR-96

## 2020-12-07 DIAGNOSIS — Z7952 Long term (current) use of systemic steroids: Secondary | ICD-10-CM | POA: Diagnosis not present

## 2020-12-07 DIAGNOSIS — Z7982 Long term (current) use of aspirin: Secondary | ICD-10-CM | POA: Diagnosis not present

## 2020-12-07 DIAGNOSIS — Z8249 Family history of ischemic heart disease and other diseases of the circulatory system: Secondary | ICD-10-CM | POA: Diagnosis not present

## 2020-12-07 DIAGNOSIS — N179 Acute kidney failure, unspecified: Secondary | ICD-10-CM | POA: Diagnosis present

## 2020-12-07 DIAGNOSIS — E860 Dehydration: Secondary | ICD-10-CM | POA: Diagnosis present

## 2020-12-07 DIAGNOSIS — N400 Enlarged prostate without lower urinary tract symptoms: Secondary | ICD-10-CM | POA: Diagnosis present

## 2020-12-07 DIAGNOSIS — Z79899 Other long term (current) drug therapy: Secondary | ICD-10-CM | POA: Diagnosis not present

## 2020-12-07 DIAGNOSIS — J1282 Pneumonia due to coronavirus disease 2019: Secondary | ICD-10-CM | POA: Diagnosis present

## 2020-12-07 DIAGNOSIS — Z6841 Body Mass Index (BMI) 40.0 and over, adult: Secondary | ICD-10-CM | POA: Diagnosis not present

## 2020-12-07 DIAGNOSIS — R0902 Hypoxemia: Secondary | ICD-10-CM | POA: Diagnosis present

## 2020-12-07 DIAGNOSIS — Z7951 Long term (current) use of inhaled steroids: Secondary | ICD-10-CM | POA: Diagnosis not present

## 2020-12-07 DIAGNOSIS — U071 COVID-19: Secondary | ICD-10-CM | POA: Diagnosis present

## 2020-12-07 DIAGNOSIS — R7989 Other specified abnormal findings of blood chemistry: Secondary | ICD-10-CM | POA: Diagnosis not present

## 2020-12-07 DIAGNOSIS — J4521 Mild intermittent asthma with (acute) exacerbation: Secondary | ICD-10-CM | POA: Diagnosis present

## 2020-12-07 DIAGNOSIS — I10 Essential (primary) hypertension: Secondary | ICD-10-CM | POA: Diagnosis present

## 2020-12-07 LAB — CBC WITH DIFFERENTIAL/PLATELET
Abs Immature Granulocytes: 0.1 10*3/uL — ABNORMAL HIGH (ref 0.00–0.07)
Basophils Absolute: 0 10*3/uL (ref 0.0–0.1)
Basophils Relative: 0 %
Eosinophils Absolute: 0 10*3/uL (ref 0.0–0.5)
Eosinophils Relative: 0 %
HCT: 39.3 % (ref 39.0–52.0)
Hemoglobin: 12.4 g/dL — ABNORMAL LOW (ref 13.0–17.0)
Immature Granulocytes: 1 %
Lymphocytes Relative: 5 %
Lymphs Abs: 0.4 10*3/uL — ABNORMAL LOW (ref 0.7–4.0)
MCH: 25.8 pg — ABNORMAL LOW (ref 26.0–34.0)
MCHC: 31.6 g/dL (ref 30.0–36.0)
MCV: 81.9 fL (ref 80.0–100.0)
Monocytes Absolute: 0.2 10*3/uL (ref 0.1–1.0)
Monocytes Relative: 3 %
Neutro Abs: 6.3 10*3/uL (ref 1.7–7.7)
Neutrophils Relative %: 91 %
Platelets: 237 10*3/uL (ref 150–400)
RBC: 4.8 MIL/uL (ref 4.22–5.81)
RDW: 15.7 % — ABNORMAL HIGH (ref 11.5–15.5)
WBC: 7 10*3/uL (ref 4.0–10.5)
nRBC: 0 % (ref 0.0–0.2)

## 2020-12-07 LAB — COMPREHENSIVE METABOLIC PANEL
ALT: 30 U/L (ref 0–44)
AST: 33 U/L (ref 15–41)
Albumin: 3.1 g/dL — ABNORMAL LOW (ref 3.5–5.0)
Alkaline Phosphatase: 49 U/L (ref 38–126)
Anion gap: 14 (ref 5–15)
BUN: 42 mg/dL — ABNORMAL HIGH (ref 8–23)
CO2: 19 mmol/L — ABNORMAL LOW (ref 22–32)
Calcium: 8.2 mg/dL — ABNORMAL LOW (ref 8.9–10.3)
Chloride: 104 mmol/L (ref 98–111)
Creatinine, Ser: 1.45 mg/dL — ABNORMAL HIGH (ref 0.61–1.24)
GFR, Estimated: 51 mL/min — ABNORMAL LOW (ref >60)
Glucose, Bld: 147 mg/dL — ABNORMAL HIGH (ref 70–99)
Potassium: 4.8 mmol/L (ref 3.5–5.1)
Sodium: 137 mmol/L (ref 135–145)
Total Bilirubin: 0.4 mg/dL (ref 0.3–1.2)
Total Protein: 7.1 g/dL (ref 6.5–8.1)

## 2020-12-07 LAB — D-DIMER, QUANTITATIVE
D-Dimer, Quant: 0.87 ug/mL-FEU — ABNORMAL HIGH (ref 0.00–0.50)
D-Dimer, Quant: 13.06 ug/mL-FEU — ABNORMAL HIGH (ref 0.00–0.50)

## 2020-12-07 LAB — MAGNESIUM: Magnesium: 2.3 mg/dL (ref 1.7–2.4)

## 2020-12-07 LAB — C-REACTIVE PROTEIN: CRP: 8.4 mg/dL — ABNORMAL HIGH (ref <1.0)

## 2020-12-07 LAB — PROCALCITONIN: Procalcitonin: 0.19 ng/mL

## 2020-12-07 MED ORDER — LACTATED RINGERS IV SOLN: INTRAVENOUS | Status: AC

## 2020-12-07 MED ORDER — ENOXAPARIN SODIUM 60 MG/0.6ML ~~LOC~~ SOLN
0.5000 mg/kg | SUBCUTANEOUS | Status: DC
  Administered 2020-12-08 – 2020-12-09 (×2): 50 mg via SUBCUTANEOUS
  Filled 2020-12-07 (×2): qty 0.6

## 2020-12-07 NOTE — Plan of Care (Signed)
POC discussed with pt, pt alert and oriented x4, shows no signs of distress, VSS. Pt currently receiving Remdesivir. Pt on room air. No complaints of pain. Bed wheels locked, nonskid footwear applied OOB, call bell within reach, encouraged to use call bell for assistance, no falls during shift   Problem: Education: Goal: Knowledge of risk factors and measures for prevention of condition will improve Outcome: Progressing   Problem: Coping: Goal: Psychosocial and spiritual needs will be supported Outcome: Progressing   Problem: Respiratory: Goal: Will maintain a patent airway Outcome: Progressing Goal: Complications related to the disease process, condition or treatment will be avoided or minimized Outcome: Progressing   Problem: Education: Goal: Knowledge of General Education information will improve Description: Including pain rating scale, medication(s)/side effects and non-pharmacologic comfort measures Outcome: Progressing   Problem: Health Behavior/Discharge Planning: Goal: Ability to manage health-related needs will improve Outcome: Progressing   Problem: Clinical Measurements: Goal: Ability to maintain clinical measurements within normal limits will improve Outcome: Progressing Goal: Will remain free from infection Outcome: Progressing Goal: Diagnostic test results will improve Outcome: Progressing Goal: Respiratory complications will improve Outcome: Progressing Goal: Cardiovascular complication will be avoided Outcome: Progressing   Problem: Activity: Goal: Risk for activity intolerance will decrease Outcome: Progressing   Problem: Nutrition: Goal: Adequate nutrition will be maintained Outcome: Progressing   Problem: Coping: Goal: Level of anxiety will decrease Outcome: Progressing   Problem: Elimination: Goal: Will not experience complications related to bowel motility Outcome: Progressing Goal: Will not experience complications related to urinary  retention Outcome: Progressing   Problem: Pain Managment: Goal: General experience of comfort will improve Outcome: Progressing   Problem: Safety: Goal: Ability to remain free from injury will improve Outcome: Progressing   Problem: Skin Integrity: Goal: Risk for impaired skin integrity will decrease Outcome: Progressing

## 2020-12-07 NOTE — ED Notes (Signed)
Assisted patient onto bed side commode

## 2020-12-07 NOTE — H&P (Signed)
History and Physical    Bryan Fitzgerald:[id number] DOB: 24-Apr-1946 DOA: 12/06/2020  PCP: Corine Shelter, MD  Patient coming from: Urgent care clinic office via EMS   Chief Complaint:  Chief Complaint  Patient presents with  . Covid Positive  . Fever  . Chills  . Generalized Body Aches     HPI:    74 year old male with past medical history of mild intermittent asthma, hypertension, benign prostatic hyperplasia presenting to [facility] emergency department via EMS from local urgent care clinic office due to hypoxia.  Patient explains that he has been experiencing shortness of breath for at least 1 week.    Patient explains that in the week preceding the onset of his symptoms his wife came down ill with Covid.  Shortly thereafter, he became sick as well.  Patient describes shortness of breath, initially mild in intensity progressively becoming more and more severe.  Shortness breath is worse with exertion and improved with rest.  The shortness of breath is associated with cough and intermittent wheezing.  Patient is also been experiencing occasional watery diarrhea over the span of time.  Patient also complains of poor appetite and progressively worsening generalized weakness.  Patient was seen in urgent care clinic earlier in the day on 12/17 with patient was found to be hypoxic with oxygen saturations near 80%.  Patient was then sent [facility] for evaluation via EMS.  Upon evaluation in the emergency department, patient has been found to be tachypneic with respiratory rate in excess of 30 breaths/min.  Chest x-[place] reveals bilateral patchy perihilar and basilar opacities.  PCR testing is found to be positive.  The hospitalist group has been called to assess the patient for admission the hospital.   Review of Systems:   Review of Systems  Constitutional: Positive for malaise/fatigue.  Respiratory: Positive for cough, shortness of breath and  wheezing.   Neurological: Positive for weakness.  All other systems reviewed and are negative.   Past Medical History:  Diagnosis Date  . Asthma   . Hypertension   . Seasonal allergies     History reviewed. No pertinent surgical history.   reports that he has never smoked. He has never used smokeless tobacco. He reports that he does not drink alcohol and does not use drugs.  No Known Allergies  Family History  Problem Relation Age of Onset  . Heart attack Mother      Prior to Admission medications   Medication Sig Start Date End Date Taking? Authorizing Provider  acetaminophen (TYLENOL) 325 MG tablet Take 2 tablets (650 mg total) by mouth every 6 (six) hours as needed for mild pain (or Fever >/= 101). 02/01/20  Yes Alwyn Ren, MD  ADVAIR DISKUS 250-50 MCG/DOSE AEPB Inhale 1 puff into the lungs daily.  01/22/20  Yes [provider]  amLODipine (NORVASC) 2.5 MG tablet Take 2.5 mg by mouth daily. 10/10/18  Yes [provider]  aspirin 81 MG tablet Take 81 mg by mouth daily.   Yes [provider]  doxazosin (CARDURA) 8 MG tablet Take 8 mg by mouth every evening.  01/04/19  Yes [provider]  finasteride (PROSCAR) 5 MG tablet Take 5 mg by mouth every evening.    Yes [provider]  GARLIC PO Take 1 capsule by mouth daily.   Yes [provider]  ipratropium (ATROVENT) 0.02 % nebulizer solution Take 0.5 mg by nebulization every 6 (six) hours as needed for  wheezing or shortness of breath. 02/13/20  Yes [provider]  losartan-hydrochlorothiazide (HYZAAR) 100-12.5 MG tablet Take 1 tablet by mouth daily. Please start taking it on 2/15 Patient taking differently: Take 1 tablet by mouth daily. 02/01/20  Yes Alwyn RenMathews, Elizabeth G, MD  montelukast (SINGULAIR) 10 MG tablet Take 10 mg by mouth daily.    Yes [provider]  Multiple Vitamin (MULTIVITAMIN ADULT PO) Take 1 tablet by mouth daily.   Yes [provider]  predniSONE (DELTASONE) 10 MG tablet Take 1 tablet (10 mg total) by mouth daily. 02/01/20  Yes Alwyn RenMathews, Elizabeth G, MD  SYMBICORT 160-4.5 MCG/ACT inhaler Inhale 2 puffs into the lungs daily.  10/10/18  Yes [provider]  albuterol (PROVENTIL) 4 MG tablet Take 4 mg by mouth 2 (two) times daily. 04/09/20   [provider]  albuterol (VENTOLIN HFA) 108 (90 Base) MCG/ACT inhaler  11/11/20   [provider]  COMBIVENT RESPIMAT 20-100 MCG/ACT AERS respimat Inhale into the lungs. 10/18/20   [provider]  RESTASIS 0.05 % ophthalmic emulsion  07/17/20   [provider]    Physical Exam: Vitals:   12/07/20 0045 12/07/20 0300 12/07/20 0500 12/07/20 0730  BP: 137/78 (!) 151/87 133/78 (!) 152/76  Pulse: 95 100 96 (!) 106  Resp: 20 (!) 30 (!) 36 (!) 28  Temp:      TempSrc:      SpO2: 95% 92% 92% 92%  Weight:      Height:        Constitutional: Acute alert and oriented x3, Patient is in respiratory distress. Skin: no rashes, no lesions, poor skin turgor noted. Eyes: Pupils are equally reactive to light.  No evidence of scleral icterus or conjunctival pallor.  ENMT: Dry mucous membranes noted.  Posterior pharynx clear of any exudate or lesions.   Neck: normal, supple, no masses, no thyromegaly.  No evidence of jugular venous distension.   Respiratory: Notable bibasilar rales.  Notable intermittent expiratory wheezing heard in all fields.  Patient is notably tachypneic with mild accessory muscle use.   Cardiovascular: Tachycardic rate with regular rhythm, no murmurs / rubs / gallops. No extremity edema. 2+ pedal pulses. No carotid bruits.  Chest:   Nontender without crepitus or deformity.   Back:   Nontender without crepitus or deformity. Abdomen: Abdomen is quite protuberant but soft and nontender.  No evidence of intra-abdominal masses.  Positive bowel sounds noted in all quadrants.   Musculoskeletal: No joint deformity upper and lower  extremities. Good ROM, no contractures. Normal muscle tone.  Neurologic: CN 2-12 grossly intact. Sensation intact.  Patient moving all 4 extremities spontaneously.  Patient is following all commands.  Patient is responsive to verbal stimuli.   Psychiatric: Patient exhibits normal mood with appropriate affect.  Patient seems to possess insight as to their current situation.     Labs on Admission: I have personally reviewed following labs and imaging studies -   CBC: Recent Labs  Lab 12/06/20 1754 12/07/20 0628  WBC 6.4 7.0  NEUTROABS 5.0 6.3  HGB 12.3* 12.4*  HCT 38.9* 39.3  MCV 81.4 81.9  PLT 226 237   Basic Metabolic Panel: Recent Labs  Lab 12/06/20 1754 12/07/20 0628  NA 135 137  K 4.9 4.8  CL 101 104  CO2 21* 19*  GLUCOSE 109* 147*  BUN 50* 42*  CREATININE 1.85* 1.45*  CALCIUM 8.3* 8.2*  MG  --  2.3   GFR: Estimated Creatinine Clearance: 47.4 mL/min (A) (  by C-G formula based on SCr of 1.45 mg/dL (H)). Liver Function Tests: Recent Labs  Lab 12/06/20 1754 12/07/20 0628  AST 35 33  ALT 26 30  ALKPHOS 48 49  BILITOT 0.5 0.4  PROT 7.1 7.1  ALBUMIN 3.3* 3.1*   No results for input(s): LIPASE, AMYLASE in the last 168 hours. No results for input(s): AMMONIA in the last 168 hours. Coagulation Profile: No results for input(s): INR, PROTIME in the last 168 hours. Cardiac Enzymes: No results for input(s): CKTOTAL, CKMB, CKMBINDEX, TROPONINI in the last 168 hours. BNP (last 3 results) No results for input(s): PROBNP in the last 8760 hours. HbA1C: No results for input(s): HGBA1C in the last 72 hours. CBG: No results for input(s): GLUCAP in the last 168 hours. Lipid Profile: No results for input(s): CHOL, HDL, LDLCALC, TRIG, CHOLHDL, LDLDIRECT in the last 72 hours. Thyroid Function Tests: No results for input(s): TSH, T4TOTAL, FREET4, T3FREE, THYROIDAB in the last 72 hours. Anemia Panel: No results for input(s): VITAMINB12, FOLATE, FERRITIN, TIBC, IRON, RETICCTPCT  in the last 72 hours. Urine analysis: No results found for: COLORURINE, APPEARANCEUR, LABSPEC, PHURINE, GLUCOSEU, HGBUR, BILIRUBINUR, KETONESUR, PROTEINUR, UROBILINOGEN, NITRITE, LEUKOCYTESUR  Radiological Exams on Admission - Personally Reviewed: DG Chest Port 1 View  Result Date: 12/06/2020 CLINICAL DATA:  Short of breath, COVID EXAM: PORTABLE CHEST 1 VIEW COMPARISON:  01/31/2020 FINDINGS: Patchy and streaky perihilar and basilar opacities. Stable cardiomediastinal silhouette with aortic atherosclerosis. No pneumothorax IMPRESSION: Minimal patchy and streaky perihilar and basilar opacities, probable areas of pneumonia. Electronically Signed   By: Jasmine Pang M.D.   On: 12/06/2020 18:31    Telemetry: Personally reviewed.  Rhythm is sinus tachycardia  Assessment/Plan Principal Problem:   COVID-19 virus infection   Patient presenting with progressively worsening shortness of breath, cough and weakness  Chest x-[place] revealing bilateral patchy infiltrates with COVID-19 PCR testing found to be positive  Considering patient's known history of asthma as well as advanced age patient is at high risk of rapid clinical decompensation  Providing patient with submental oxygen to achieve oxygen saturations of 94% or higher  Scheduled bronchodilator therapy via MDI for concurrent asthma exacerbation  Intravenous systemic steroids  Providing patient with intravenous remdesivir  Placing patient in COVID-19 specialty unit  As needed antitussives  Zinc and vitamin C supplements  Active Problems:   Mild intermittent asthma with acute exacerbation   Patient is likely suffering from concurrent asthma exacerbation secondary to COVID-19 infection  Scheduled bronchodilator therapy  Systemic steroids  Remainder of assessment and plan as above    AKI (acute kidney injury) (HCC)   Patient is suffering from mild acute kidney injury, likely prerenal in origin secondary to poor oral intake in  the past several days  Hydrating patient with intravenous isotonic fluids  Monitoring renal function with serial chemistries  Avoiding nephrotoxic agents  Strict input and output monitoring    Benign prostatic hyperplasia   Continue home regimen of Cardura    Essential hypertension    Continue home regimen of antihypertensive therapy with exception of losartan and hydrochlorothiazide which have have been held in light of acute kidney injury   Code Status:  Full code Family Communication: deferred   Status is: Observation  The patient remains OBS appropriate and will d/c before 2 midnights.  Dispo: The patient is from: Home              Anticipated d/c is to: Home  Anticipated d/c date is: 2 days              Patient currently is not medically stable to d/c.        Marinda Elk MD [facility] Pager [phone]  If 7PM-7AM, please contact night-coverage [email or web address] Use universal [place] password for that web site. If you do not have the password, please call the hospital operator.  12/07/2020, 8:10 AM

## 2020-12-07 NOTE — Progress Notes (Signed)
PROGRESS NOTE    Bryan Fitzgerald  OZH:[id number] DOB: 12-18-1946 DOA: 12/06/2020 PCP: Corine Shelter, MD   Brief Narrative: Taken from H&P 74 year old male with past medical history of mild intermittent asthma, hypertension, benign prostatic hyperplasia presenting to [facility] emergency department via EMS from local urgent care clinic office due to hypoxia.  Symptoms started approximately 1 week ago.  Found to be positive for COVID-19 pneumonia.  Patient is unvaccinated. He was started on remdesivir and steroid.  Subjective: Patient was feeling little better when seen today.  Still having some diarrhea.  Assessment & Plan:   Principal Problem:   COVID-19 virus infection Active Problems:   Essential hypertension   Benign prostatic hyperplasia   Mild intermittent asthma with acute exacerbation   AKI (acute kidney injury) (HCC)   Pneumonia due to COVID-19 virus  COVID-19 pneumonia.  Elevated inflammatory markers with marked increase in D-dimer today to 13.06.  Saturating in low 90s on room air.  Procalcitonin at 0.19.  Chest x-Luper with bilateral infiltrate. -Continue remdesivir and steroid-day 2 -Continue with supportive care -Supplemental oxygen to keep the saturation above 90% if needed. -Continue with supplements. -Continue to monitor inflammatory markers. -Will obtain CTA if D-dimer continue to rise, we will hold due to AKI at this time.  AKI.  Likely secondary to dehydration with poor p.o. intake.  Creatinine at 1.45 with BUN of 42.  Baseline around 1. -Continue with gentle IV fluid for another day. -Monitor renal function. -Avoid nephrotoxins  BPH. -Continue home dose of Cardura.  Hypertension.  Blood pressure within goal. -Continue home dose of amlodipine and Cardura. -Holding losartan and HCTZ due to AKI. -IV labetalol as needed.  Morbid obesity. Body mass index is 40.24 kg/m.  This will complicate overall prognosis.   Objective: Vitals:    12/07/20 0045 12/07/20 0300 12/07/20 0500 12/07/20 0730  BP: 137/78 (!) 151/87 133/78 (!) 152/76  Pulse: 95 100 96 (!) 106  Resp: 20 (!) 30 (!) 36 (!) 28  Temp:      TempSrc:      SpO2: 95% 92% 92% 92%  Weight:      Height:       No intake or output data in the 24 hours ending 12/07/20 0851 Filed Weights   12/06/20 1854  Weight: 99.8 kg    Examination:  General exam: Appears calm and comfortable  Respiratory system: Clear to auscultation. Respiratory effort normal. Cardiovascular system: S1 & S2 heard, RRR. No JVD, murmurs, rubs, gallops or clicks. Gastrointestinal system: Soft, nontender, nondistended, bowel sounds positive. Central nervous system: Alert and oriented. No focal neurological deficits.Symmetric 5 x 5 power. Extremities: No edema, no cyanosis, pulses intact and symmetrical. Psychiatry: Judgement and insight appear normal.     DVT prophylaxis: Lovenox Code Status: Full Family Communication: Discussed with patient and his wife who is also admitted on the same floor with COVID-19 pneumonia. Disposition Plan:  Status is: Inpatient  Remains inpatient appropriate because:Inpatient level of care appropriate due to severity of illness   Dispo: The patient is from: Home              Anticipated d/c is to: Home              Anticipated d/c date is: 2 days              Patient currently is not medically stable to d/c.   Consultants:   None  Procedures:  Antimicrobials:   Data Reviewed: I have personally  reviewed following labs and imaging studies  CBC: Recent Labs  Lab 12/06/20 1754 12/07/20 0628  WBC 6.4 7.0  NEUTROABS 5.0 6.3  HGB 12.3* 12.4*  HCT 38.9* 39.3  MCV 81.4 81.9  PLT 226 237   Basic Metabolic Panel: Recent Labs  Lab 12/06/20 1754 12/07/20 0628  NA 135 137  K 4.9 4.8  CL 101 104  CO2 21* 19*  GLUCOSE 109* 147*  BUN 50* 42*  CREATININE 1.85* 1.45*  CALCIUM 8.3* 8.2*  MG  --  2.3   GFR: Estimated Creatinine Clearance: 47.4  mL/min (A) (by C-G formula based on SCr of 1.45 mg/dL (H)). Liver Function Tests: Recent Labs  Lab 12/06/20 1754 12/07/20 0628  AST 35 33  ALT 26 30  ALKPHOS 48 49  BILITOT 0.5 0.4  PROT 7.1 7.1  ALBUMIN 3.3* 3.1*   No results for input(s): LIPASE, AMYLASE in the last 168 hours. No results for input(s): AMMONIA in the last 168 hours. Coagulation Profile: No results for input(s): INR, PROTIME in the last 168 hours. Cardiac Enzymes: No results for input(s): CKTOTAL, CKMB, CKMBINDEX, TROPONINI in the last 168 hours. BNP (last 3 results) No results for input(s): PROBNP in the last 8760 hours. HbA1C: No results for input(s): HGBA1C in the last 72 hours. CBG: No results for input(s): GLUCAP in the last 168 hours. Lipid Profile: No results for input(s): CHOL, HDL, LDLCALC, TRIG, CHOLHDL, LDLDIRECT in the last 72 hours. Thyroid Function Tests: No results for input(s): TSH, T4TOTAL, FREET4, T3FREE, THYROIDAB in the last 72 hours. Anemia Panel: No results for input(s): VITAMINB12, FOLATE, FERRITIN, TIBC, IRON, RETICCTPCT in the last 72 hours. Sepsis Labs: Recent Labs  Lab 12/06/20 0104  PROCALCITON 0.19    Recent Results (from the past 240 hour(s))  Resp Panel by RT-PCR (Flu A&B, Covid) Nasopharyngeal Swab     Status: Abnormal   Collection Time: 12/06/20  5:55 PM   Specimen: Nasopharyngeal Swab; Nasopharyngeal(NP) swabs in vial transport medium  Result Value Ref Range Status   SARS Coronavirus 2 by RT PCR POSITIVE (A) NEGATIVE Final    Comment: CRITICAL RESULT CALLED TO, READ BACK BY AND VERIFIED WITH: JEANINE NASH RN 12/06/2020 @2052  BY P.HENDERSON (NOTE) SARS-CoV-2 target nucleic acids are DETECTED.  The SARS-CoV-2 RNA is generally detectable in upper respiratory specimens during the acute phase of infection. Positive results are indicative of the presence of the identified virus, but do not rule out bacterial infection or co-infection with other pathogens not detected by  the test. Clinical correlation with patient history and other diagnostic information is necessary to determine patient infection status. The expected result is Negative.  Fact Sheet for Patients:  Fact Sheet for Healthcare Providers: BloggerCourse.com  This test is not yet approved or cleared by the [place] FDA and  has been authorized for detection and/or diagnosis of SARS-CoV-2 by FDA under an Emergency Use Authorization (EUA).  This EUA will remain in effect (m eaning this test can be used) for the duration of  the COVID-19 declaration under Section 564(b)(1) of the Act, 21 U.S.C. section 360bbb-3(b)(1), unless the authorization is terminated or revoked sooner.     Influenza A by PCR NEGATIVE NEGATIVE Final   Influenza B by PCR NEGATIVE NEGATIVE Final    Comment: (NOTE) The Xpert Xpress SARS-CoV-2/FLU/RSV plus assay is intended as an aid in the diagnosis of influenza from Nasopharyngeal swab specimens and should not be used as a sole basis for treatment. Nasal washings and aspirates are  unacceptable for Xpert Xpress SARS-CoV-2/FLU/RSV testing.  Fact Sheet for Patients: BloggerCourse.com  Fact Sheet for Healthcare Providers: SeriousBroker.it  This test is not yet approved or cleared by the [place] FDA and has been authorized for detection and/or diagnosis of SARS-CoV-2 by FDA under an Emergency Use Authorization (EUA). This EUA will remain in effect (meaning this test can be used) for the duration of the COVID-19 declaration under Section 564(b)(1) of the Act, 21 U.S.C. section 360bbb-3(b)(1), unless the authorization is terminated or revoked.  Performed at [facility], [address] [place], [place] [address]      Radiology Studies: [facility] Chest Port 1 View  Result Date: 12/06/2020 CLINICAL DATA:  Short of breath, COVID  EXAM: PORTABLE CHEST 1 VIEW COMPARISON:  01/31/2020 FINDINGS: Patchy and streaky perihilar and basilar opacities. Stable cardiomediastinal silhouette with aortic atherosclerosis. No pneumothorax IMPRESSION: Minimal patchy and streaky perihilar and basilar opacities, probable areas of pneumonia. Electronically Signed   By: Jasmine Pang M.D.   On: 12/06/2020 18:31    Scheduled Meds: . albuterol  2.5 mg Nebulization Once  . albuterol  2.5 mg Nebulization Once  . albuterol  2 puff Inhalation Q6H  . amLODipine  2.5 mg Oral Daily  . vitamin C  500 mg Oral Daily  . aspirin  81 mg Oral Daily  . doxazosin  8 mg Oral QPM  . enoxaparin (LOVENOX) injection  40 mg Subcutaneous Q24H  . finasteride  5 mg Oral QPM  . ipratropium  0.5 mg Nebulization Once  . methylPREDNISolone (SOLU-MEDROL) injection  60 mg Intravenous Q12H  . montelukast  10 mg Oral Daily  . zinc sulfate  220 mg Oral Daily   Continuous Infusions: . lactated ringers    . remdesivir 100 mg in NS 100 mL       LOS: 0 days   Time spent: 35 minutes.  Arnetha Courser, MD [facility]  If 7PM-7AM, please contact night-coverage [email or web address]  12/07/2020, 8:51 AM   This record has been created using Conservation officer, historic buildings. Errors have been sought and corrected,but may not always be located. Such creation errors do not reflect on the standard of care.

## 2020-12-07 NOTE — ED Notes (Signed)
Assisted patient off of bedside commode.

## 2020-12-07 NOTE — ED Notes (Signed)
ED TO INPATIENT HANDOFF REPORT  ED Nurse Name and Phone #: [phone]  S Name/Age/Gender Bryan Fitzgerald 74 y.o. male Room/Bed: WA21/WA21  Code Status   Code Status: Full Code  Home/SNF/Other Home Patient oriented to: self, place, time and situation Is this baseline? Yes   Triage Complete: Triage complete  Chief Complaint COVID-19 virus infection [U07.1]  Triage Note Pt BIB EMS. Pt was being seen at his PCP when he tested positive for covid. PCP stated pt bp was 90/45 and O2 was 80% RA. Per EMS pt has been having body aches, chills and fever for a few days; pt wife has covid. PCP advised pt to come to ER.   BP-109/45 O2-93%RA; 97% 2LNC HR-96     Allergies No Known Allergies  Level of Care/Admitting Diagnosis ED Disposition    ED Disposition Condition Comment   Admit  Hospital Area: [facility]  HOSPITAL [[id number]]  Level of Care: Telemetry [5]  Admit to tele based on following criteria: Other see comments  Comments: Covid, respiratory distress  Covid Evaluation: Confirmed COVID Positive  Diagnosis: COVID-19 virus infection [[id number]]  Admitting Physician: Marinda Elk [[id number]]  Attending Physician: Marinda Elk [[id number]]       B Medical/Surgery History Past Medical History:  Diagnosis Date   Asthma    Hypertension    Seasonal allergies    History reviewed. No pertinent surgical history.   A IV Location/Drains/Wounds Patient Lines/Drains/Airways Status    Active Line/Drains/Airways    Name Placement date Placement time Site Days   Peripheral IV 12/06/20 Right;Lateral Antecubital 12/06/20  1848  Antecubital  1   Peripheral IV 12/07/20 Left Hand 12/07/20  0051  Hand  less than 1          Intake/Output Last 24 hours No intake or output data in the 24 hours ending 12/07/20 5993  Labs/Imaging Results for orders placed or performed during the hospital encounter of 12/06/20 (from the past 48 hour(s))  Procalcitonin     Status:  None   Collection Time: 12/06/20  1:04 AM  Result Value Ref Range   Procalcitonin 0.19 ng/mL    Comment:        Interpretation: PCT (Procalcitonin) <= 0.5 ng/mL: Systemic infection (sepsis) is not likely. Local bacterial infection is possible. (NOTE)       Sepsis PCT Algorithm           Lower Respiratory Tract                                      Infection PCT Algorithm    ----------------------------     ----------------------------         PCT < 0.25 ng/mL                PCT < 0.10 ng/mL          Strongly encourage             Strongly discourage   discontinuation of antibiotics    initiation of antibiotics    ----------------------------     -----------------------------       PCT 0.25 - 0.50 ng/mL            PCT 0.10 - 0.25 ng/mL               OR       >80% decrease in PCT  Discourage initiation of                                            antibiotics      Encourage discontinuation           of antibiotics    ----------------------------     -----------------------------         PCT >= 0.50 ng/mL              PCT 0.26 - 0.50 ng/mL               AND        <80% decrease in PCT             Encourage initiation of                                             antibiotics       Encourage continuation           of antibiotics    ----------------------------     -----------------------------        PCT >= 0.50 ng/mL                  PCT > 0.50 ng/mL               AND         increase in PCT                  Strongly encourage                                      initiation of antibiotics    Strongly encourage escalation           of antibiotics                                     -----------------------------                                           PCT <= 0.25 ng/mL                                                 OR                                        > 80% decrease in PCT                                      Discontinue / Do not initiate  antibiotics  Performed at [facility], [address], [place], [place] [address]   D-dimer, quantitative (not at [facility])     Status: Abnormal   Collection Time: 12/06/20  1:04 AM  Result Value Ref Range   D-Dimer, Quant 0.87 (H) 0.00 - 0.50 ug/mL-FEU    Comment: (NOTE) At the manufacturer cut-off value of 0.5 g/mL FEU, this assay has a negative predictive value of 95-100%.This assay is intended for use in conjunction with a clinical pretest probability (PTP) assessment model to exclude pulmonary embolism (PE) and deep venous thrombosis (DVT) in outpatients suspected of PE or DVT. Results should be correlated with clinical presentation. Performed at [facility], [address], [place], [place] [address]   CBC with Differential/Platelet     Status: Abnormal   Collection Time: 12/06/20  5:54 PM  Result Value Ref Range   WBC 6.4 4.0 - 10.5 K/uL   RBC 4.78 4.22 - 5.81 MIL/uL   Hemoglobin 12.3 (L) 13.0 - 17.0 g/dL   HCT 09.8 (L) 11.9 - 14.7 %   MCV 81.4 80.0 - 100.0 fL   MCH 25.7 (L) 26.0 - 34.0 pg   MCHC 31.6 30.0 - 36.0 g/dL   RDW 82.9 56.2 - 13.0 %   Platelets 226 150 - 400 K/uL   nRBC 0.0 0.0 - 0.2 %   Neutrophils Relative % 77 %   Neutro Abs 5.0 1.7 - 7.7 K/uL   Lymphocytes Relative 11 %   Lymphs Abs 0.7 0.7 - 4.0 K/uL   Monocytes Relative 11 %   Monocytes Absolute 0.7 0.1 - 1.0 K/uL   Eosinophils Relative 0 %   Eosinophils Absolute 0.0 0.0 - 0.5 K/uL   Basophils Relative 0 %   Basophils Absolute 0.0 0.0 - 0.1 K/uL   Immature Granulocytes 1 %   Abs Immature Granulocytes 0.07 0.00 - 0.07 K/uL    Comment: Performed at [facility], [address] [place], [place] [address]  Comprehensive metabolic panel     Status: Abnormal   Collection Time: 12/06/20  5:54 PM  Result Value Ref Range   Sodium 135 135 - 145 mmol/L   Potassium 4.9 3.5 - 5.1 mmol/L   Chloride 101 98 - 111 mmol/L   CO2 21 (L)  22 - 32 mmol/L   Glucose, Bld 109 (H) 70 - 99 mg/dL    Comment: Glucose reference range applies only to samples taken after fasting for at least 8 hours.   BUN 50 (H) 8 - 23 mg/dL   Creatinine, Ser 4.69 (H) 0.61 - 1.24 mg/dL   Calcium 8.3 (L) 8.9 - 10.3 mg/dL   Total Protein 7.1 6.5 - 8.1 g/dL   Albumin 3.3 (L) 3.5 - 5.0 g/dL   AST 35 15 - 41 U/L   ALT 26 0 - 44 U/L   Alkaline Phosphatase 48 38 - 126 U/L   Total Bilirubin 0.5 0.3 - 1.2 mg/dL   GFR, Estimated 38 (L) >60 mL/min    Comment: (NOTE) Calculated using the CKD-EPI Creatinine Equation (2021)    Anion gap 13 5 - 15    Comment: Performed at [facility], [address] [place], [place] [address]  Resp Panel by RT-PCR (Flu A&B, Covid) Nasopharyngeal Swab     Status: Abnormal   Collection Time: 12/06/20  5:55 PM   Specimen: Nasopharyngeal Swab; Nasopharyngeal(NP) swabs in vial transport medium  Result Value Ref Range   SARS Coronavirus 2 by  RT PCR POSITIVE (A) NEGATIVE    Comment: CRITICAL RESULT CALLED TO, READ BACK BY AND VERIFIED WITH: JEANINE NASH RN 12/06/2020  BY P.HENDERSON (NOTE) SARS-CoV-2 target nucleic acids are DETECTED.  The SARS-CoV-2 RNA is generally detectable in upper respiratory specimens during the acute phase of infection. Positive results are indicative of the presence of the identified virus, but do not rule out bacterial infection or co-infection with other pathogens not detected by the test. Clinical correlation with patient history and other diagnostic information is necessary to determine patient infection status. The expected result is Negative.  Fact Sheet for Patients: BloggerCourse.com  Fact Sheet for Healthcare Providers: SeriousBroker.it  This test is not yet approved or cleared by the [place] FDA and  has been authorized for detection and/or diagnosis of SARS-CoV-2 by FDA under an Emergency Use Authorization  (EUA).  This EUA will remain in effect (m eaning this test can be used) for the duration of  the COVID-19 declaration under Section 564(b)(1) of the Act, 21 U.S.C. section 360bbb-3(b)(1), unless the authorization is terminated or revoked sooner.     Influenza A by PCR NEGATIVE NEGATIVE   Influenza B by PCR NEGATIVE NEGATIVE    Comment: (NOTE) The Xpert Xpress SARS-CoV-2/FLU/RSV plus assay is intended as an aid in the diagnosis of influenza from Nasopharyngeal swab specimens and should not be used as a sole basis for treatment. Nasal washings and aspirates are unacceptable for Xpert Xpress SARS-CoV-2/FLU/RSV testing.  Fact Sheet for Patients: BloggerCourse.com  Fact Sheet for Healthcare Providers: SeriousBroker.it  This test is not yet approved or cleared by the [place] FDA and has been authorized for detection and/or diagnosis of SARS-CoV-2 by FDA under an Emergency Use Authorization (EUA). This EUA will remain in effect (meaning this test can be used) for the duration of the COVID-19 declaration under Section 564(b)(1) of the Act, 21 U.S.C. section 360bbb-3(b)(1), unless the authorization is terminated or revoked.  Performed at [facility], [address] [place], [place] [address]   CBC with Differential/Platelet     Status: Abnormal   Collection Time: 12/07/20  6:28 AM  Result Value Ref Range   WBC 7.0 4.0 - 10.5 K/uL   RBC 4.80 4.22 - 5.81 MIL/uL   Hemoglobin 12.4 (L) 13.0 - 17.0 g/dL   HCT 60.4 54.0 - 98.1 %   MCV 81.9 80.0 - 100.0 fL   MCH 25.8 (L) 26.0 - 34.0 pg   MCHC 31.6 30.0 - 36.0 g/dL   RDW 19.1 (H) 47.8 - 29.5 %   Platelets 237 150 - 400 K/uL   nRBC 0.0 0.0 - 0.2 %   Neutrophils Relative % 91 %   Neutro Abs 6.3 1.7 - 7.7 K/uL   Lymphocytes Relative 5 %   Lymphs Abs 0.4 (L) 0.7 - 4.0 K/uL   Monocytes Relative 3 %   Monocytes Absolute 0.2 0.1 - 1.0 K/uL   Eosinophils Relative 0  %   Eosinophils Absolute 0.0 0.0 - 0.5 K/uL   Basophils Relative 0 %   Basophils Absolute 0.0 0.0 - 0.1 K/uL   Immature Granulocytes 1 %   Abs Immature Granulocytes 0.10 (H) 0.00 - 0.07 K/uL    Comment: Performed at [facility], [address] [place], [place] [address]  Comprehensive metabolic panel     Status: Abnormal   Collection Time: 12/07/20  6:28 AM  Result Value Ref Range   Sodium 137 135 - 145 mmol/L   Potassium 4.8  3.5 - 5.1 mmol/L    Comment: SLIGHT HEMOLYSIS   Chloride 104 98 - 111 mmol/L   CO2 19 (L) 22 - 32 mmol/L   Glucose, Bld 147 (H) 70 - 99 mg/dL    Comment: Glucose reference range applies only to samples taken after fasting for at least 8 hours.   BUN 42 (H) 8 - 23 mg/dL   Creatinine, Ser 5.63 (H) 0.61 - 1.24 mg/dL   Calcium 8.2 (L) 8.9 - 10.3 mg/dL   Total Protein 7.1 6.5 - 8.1 g/dL   Albumin 3.1 (L) 3.5 - 5.0 g/dL   AST 33 15 - 41 U/L   ALT 30 0 - 44 U/L   Alkaline Phosphatase 49 38 - 126 U/L   Total Bilirubin 0.4 0.3 - 1.2 mg/dL   GFR, Estimated 51 (L) >60 mL/min    Comment: (NOTE) Calculated using the CKD-EPI Creatinine Equation (2021)    Anion gap 14 5 - 15    Comment: Performed at [facility], [address], [place], [place] [address]  C-reactive protein     Status: Abnormal   Collection Time: 12/07/20  6:28 AM  Result Value Ref Range   CRP 8.4 (H) <1.0 mg/dL    Comment: SLIGHT HEMOLYSIS Performed at [facility], [address], [place], [place] [address]   D-dimer, quantitative (not at [facility])     Status: Abnormal   Collection Time: 12/07/20  6:28 AM  Result Value Ref Range   D-Dimer, Quant 13.06 (H) 0.00 - 0.50 ug/mL-FEU    Comment: (NOTE) At the manufacturer cut-off value of 0.5 g/mL FEU, this assay has a negative predictive value of 95-100%.This assay is intended for use in conjunction with a clinical pretest probability (PTP) assessment model to exclude pulmonary embolism (PE)  and deep venous thrombosis (DVT) in outpatients suspected of PE or DVT. Results should be correlated with clinical presentation. Performed at [facility], [address], [place], [place] [address]   Magnesium     Status: None   Collection Time: 12/07/20  6:28 AM  Result Value Ref Range   Magnesium 2.3 1.7 - 2.4 mg/dL    Comment: Performed at [facility], [address] [place], [place] [address]   DG Chest Port 1 View  Result Date: 12/06/2020 CLINICAL DATA:  Short of breath, COVID EXAM: PORTABLE CHEST 1 VIEW COMPARISON:  01/31/2020 FINDINGS: Patchy and streaky perihilar and basilar opacities. Stable cardiomediastinal silhouette with aortic atherosclerosis. No pneumothorax IMPRESSION: Minimal patchy and streaky perihilar and basilar opacities, probable areas of pneumonia. Electronically Signed   By: Jasmine Pang M.D.   On: 12/06/2020 18:31    Pending Labs Unresulted Labs (From admission, onward)          Start     Ordered   12/07/20 0500  CBC with Differential/Platelet  Daily,   R      12/06/20 2249   12/07/20 0500  Comprehensive metabolic panel  Daily,   R      12/06/20 2249   12/07/20 0500  C-reactive protein  Daily,   R      12/06/20 2249   12/07/20 0500  D-dimer, quantitative (not at [facility])  Daily,   R      12/06/20 2249   12/07/20 0500  Magnesium  Daily,   R      12/06/20 2249          Vitals/Pain Today's Vitals   12/07/20 0045 12/07/20 0300  12/07/20 0500 12/07/20 0730  BP: 137/78 (!) 151/87 133/78 (!) 152/76  Pulse: 95 100 96 (!) 106  Resp: 20 (!) 30 (!) 36 (!) 28  Temp:      TempSrc:      SpO2: 95% 92% 92% 92%  Weight:      Height:        Isolation Precautions Airborne and Contact precautions  Medications Medications  methylPREDNISolone sodium succinate (SOLU-MEDROL) 125 mg/2 mL injection 60 mg (60 mg Intravenous Given 12/07/20 0624)  albuterol (VENTOLIN HFA) 108 (90 Base) MCG/ACT inhaler 2 puff (has no  administration in time range)  albuterol (VENTOLIN HFA) 108 (90 Base) MCG/ACT inhaler 2 puff (2 puffs Inhalation Given 12/07/20 0158)  lactated ringers infusion ( Intravenous Stopped 12/07/20 0800)  aspirin chewable tablet 81 mg (has no administration in time range)  amLODipine (NORVASC) tablet 2.5 mg (has no administration in time range)  doxazosin (CARDURA) tablet 8 mg (8 mg Oral Given 12/06/20 2349)  finasteride (PROSCAR) tablet 5 mg (5 mg Oral Given 12/06/20 2348)  montelukast (SINGULAIR) tablet 10 mg (has no administration in time range)  enoxaparin (LOVENOX) injection 40 mg (has no administration in time range)  remdesivir 200 mg in sodium chloride 0.9% 250 mL IVPB (0 mg Intravenous Stopped 12/07/20 0158)    Followed by  remdesivir 100 mg in sodium chloride 0.9 % 100 mL IVPB (has no administration in time range)  guaiFENesin-dextromethorphan (ROBITUSSIN DM) 100-10 MG/5ML syrup 10 mL (has no administration in time range)  ascorbic acid (VITAMIN C) tablet 500 mg (has no administration in time range)  zinc sulfate capsule 220 mg (has no administration in time range)  acetaminophen (TYLENOL) tablet 650 mg (has no administration in time range)  polyethylene glycol (MIRALAX / GLYCOLAX) packet 17 g (has no administration in time range)  ondansetron (ZOFRAN) tablet 4 mg (has no administration in time range)    Or  ondansetron (ZOFRAN) injection 4 mg (has no administration in time range)  lactated ringers infusion (has no administration in time range)  sodium chloride 0.9 % bolus 1,000 mL (0 mLs Intravenous Stopped 12/06/20 2348)  albuterol (VENTOLIN HFA) 108 (90 Base) MCG/ACT inhaler 2 puff (2 puffs Inhalation Given 12/06/20 1841)  dexamethasone (DECADRON) injection 10 mg (10 mg Intravenous Given 12/06/20 2134)    Mobility walks Low fall risk   Focused Assessments Pulmonary Assessment Handoff:  Lung sounds:   O2 Device: Room Air        R Recommendations: See Admitting Provider  Note  Report given to:   Additional Notes:

## 2020-12-08 DIAGNOSIS — U071 COVID-19: Principal | ICD-10-CM

## 2020-12-08 LAB — COMPREHENSIVE METABOLIC PANEL
ALT: 28 U/L (ref 0–44)
AST: 21 U/L (ref 15–41)
Albumin: 3 g/dL — ABNORMAL LOW (ref 3.5–5.0)
Alkaline Phosphatase: 47 U/L (ref 38–126)
Anion gap: 12 (ref 5–15)
BUN: 49 mg/dL — ABNORMAL HIGH (ref 8–23)
CO2: 21 mmol/L — ABNORMAL LOW (ref 22–32)
Calcium: 8.4 mg/dL — ABNORMAL LOW (ref 8.9–10.3)
Chloride: 107 mmol/L (ref 98–111)
Creatinine, Ser: 1.3 mg/dL — ABNORMAL HIGH (ref 0.61–1.24)
GFR, Estimated: 58 mL/min — ABNORMAL LOW (ref >60)
Glucose, Bld: 176 mg/dL — ABNORMAL HIGH (ref 70–99)
Potassium: 4.4 mmol/L (ref 3.5–5.1)
Sodium: 140 mmol/L (ref 135–145)
Total Bilirubin: 0.5 mg/dL (ref 0.3–1.2)
Total Protein: 6.6 g/dL (ref 6.5–8.1)

## 2020-12-08 LAB — CBC WITH DIFFERENTIAL/PLATELET
Abs Immature Granulocytes: 0.22 10*3/uL — ABNORMAL HIGH (ref 0.00–0.07)
Basophils Absolute: 0 10*3/uL (ref 0.0–0.1)
Basophils Relative: 0 %
Eosinophils Absolute: 0 10*3/uL (ref 0.0–0.5)
Eosinophils Relative: 0 %
HCT: 39.3 % (ref 39.0–52.0)
Hemoglobin: 12.2 g/dL — ABNORMAL LOW (ref 13.0–17.0)
Immature Granulocytes: 4 %
Lymphocytes Relative: 8 %
Lymphs Abs: 0.5 10*3/uL — ABNORMAL LOW (ref 0.7–4.0)
MCH: 25.4 pg — ABNORMAL LOW (ref 26.0–34.0)
MCHC: 31 g/dL (ref 30.0–36.0)
MCV: 81.7 fL (ref 80.0–100.0)
Monocytes Absolute: 0.5 10*3/uL (ref 0.1–1.0)
Monocytes Relative: 9 %
Neutro Abs: 4.4 10*3/uL (ref 1.7–7.7)
Neutrophils Relative %: 79 %
Platelets: 246 10*3/uL (ref 150–400)
RBC: 4.81 MIL/uL (ref 4.22–5.81)
RDW: 15.7 % — ABNORMAL HIGH (ref 11.5–15.5)
WBC: 5.6 10*3/uL (ref 4.0–10.5)
nRBC: 0 % (ref 0.0–0.2)

## 2020-12-08 LAB — MAGNESIUM: Magnesium: 2.3 mg/dL (ref 1.7–2.4)

## 2020-12-08 LAB — D-DIMER, QUANTITATIVE: D-Dimer, Quant: 3.83 ug/mL-FEU — ABNORMAL HIGH (ref 0.00–0.50)

## 2020-12-08 LAB — C-REACTIVE PROTEIN: CRP: 6.4 mg/dL — ABNORMAL HIGH (ref <1.0)

## 2020-12-08 MED ORDER — APIXABAN 2.5 MG PO TABS: 2.5000 mg | ORAL_TABLET | Freq: Two times a day (BID) | ORAL | 0 refills | Status: DC

## 2020-12-08 MED ORDER — PREDNISONE 5 MG PO TABS: 10.0000 mg | ORAL_TABLET | Freq: Every day | ORAL | 0 refills | Status: DC

## 2020-12-08 MED ORDER — LACTATED RINGERS IV SOLN: INTRAVENOUS | Status: DC

## 2020-12-08 MED ORDER — LOPERAMIDE HCL 2 MG PO CAPS: 2.0000 mg | ORAL_CAPSULE | ORAL | Status: DC | PRN

## 2020-12-08 MED ORDER — VITAMIN D 25 MCG (1000 UNIT) PO TABS
1000.0000 [IU] | ORAL_TABLET | Freq: Every day | ORAL | Status: DC
  Administered 2020-12-08 – 2020-12-09 (×2): 1000 [IU] via ORAL
  Filled 2020-12-08 (×2): qty 1

## 2020-12-08 MED ORDER — ASCORBIC ACID 500 MG PO TABS: 500.0000 mg | ORAL_TABLET | Freq: Every day | ORAL | 0 refills | Status: AC

## 2020-12-08 MED ORDER — LOPERAMIDE HCL 2 MG PO CAPS: 2.0000 mg | ORAL_CAPSULE | ORAL | 0 refills | Status: DC | PRN

## 2020-12-08 MED ORDER — VITAMIN D3 25 MCG PO TABS: 1000.0000 [IU] | ORAL_TABLET | Freq: Every day | ORAL | 0 refills | Status: AC

## 2020-12-08 MED ORDER — ALBUTEROL SULFATE HFA 108 (90 BASE) MCG/ACT IN AERS: 2.0000 | INHALATION_SPRAY | Freq: Four times a day (QID) | RESPIRATORY_TRACT | 0 refills | Status: AC | PRN

## 2020-12-08 MED ORDER — ZINC SULFATE 220 (50 ZN) MG PO CAPS: 220.0000 mg | ORAL_CAPSULE | Freq: Every day | ORAL | 0 refills | Status: AC

## 2020-12-08 NOTE — Discharge Summary (Signed)
Discharge Summary  Bryan ShelterWalter L Fitzgerald MVH:[id number] DOB: 26-Sep-1946  PCP: Corine ShelterKilpatrick, George, MD  Admit date: 12/06/2020 Discharge date: 12/08/2020  Time spent: 35 minutes  Recommendations for Outpatient Follow-up:  1. Follow-up with your PCP 2. Follow-up with your pulmonologist 3. Take your medications as prescribed  Discharge Diagnoses:  Active Hospital Problems   Diagnosis Date Noted  . COVID-19 virus infection 12/06/2020  . AKI (acute kidney injury) (HCC) 12/07/2020  . Pneumonia due to COVID-19 virus 12/07/2020  . Mild intermittent asthma with acute exacerbation   . Benign prostatic hyperplasia 11/07/2013  . Essential hypertension 11/07/2013    Resolved Hospital Problems  No resolved problems to display.    Discharge Condition: Stable  Diet recommendation: Resume previous diet.  Vitals:   12/08/20 0601 12/08/20 1318  BP: 125/66 137/69  Pulse: 87 95  Resp: (!) 24 (!) 22  Temp: 97.8 F (36.6 C) 98.1 F (36.7 C)  SpO2: 97% 96%    History of present illness:  74 year old male with past medical history of mild intermittent asthma, hypertension, benign prostatic hyperplasia [facility] local urgent care clinic office due to hypoxia.  Symptoms started approximately 1 week ago.  Found to be positive for COVID-19 pneumonia.  Patient is unvaccinated. He was started on remdesivir and steroid.  Passed home O2 evaluation on 12/08/2020: SATURATION QUALIFICATIONS: (This note is used to comply with regulatory documentation for home oxygen)  Patient Saturations on Room Air at Rest = 91%  Patient Saturations on Room Air while Ambulating = 90%  Please briefly explain why patient needs home oxygen: Patient desats mildly to 89% while ambulating but recovers back to 90's quickly. Patient will not need home oxygen.   Drema DallasLangley, Christa G, LPN  Licensed Practical Nurse  Nursing   12/08/20: Seen and examined at his bedside.   No acute events overnight.  He has no new complaints.  He is eager to go home.  Denies any chest pain or dyspnea.  States he is tolerating a diet well.  No nausea.  No difficulty with oral intake.   Hospital Course:  Principal Problem:   COVID-19 virus infection Active Problems:   Essential hypertension   Benign prostatic hyperplasia   Mild intermittent asthma with acute exacerbation   AKI (acute kidney injury) (HCC)   Pneumonia due to COVID-19 virus  COVID-19 viral pneumonia.   Received 3 days of remdesivir and IV steroids Plan is to return for remaining 2 doses of IV remdesivir.  Continue prednisone steroid taper x10 days. Continue vitamin C, D3 and zinc. Continue bronchodilator and incentive spirometer Start Eliquis 2.5 mg twice daily for DVT prophylaxis.  Resolving AKI, likely prerenal secondary to dehydration  Last creatinine 1.02 on 02/01/2020 with GFR greater than 60. Creatinine downtrending 1.30 from 1.85. Continue to avoid nephrotoxins, hypotension and dehydration. Follow-up with your PCP  BPH. -Continue home dose of Cardura and Proscar.  Hypertension.   BP is at goal  -Continue home dose of amlodipine and Cardura. Holding losartan and HCTZ due to AKI. Follow-up with your PCP.  Severe morbid obesity. Body mass index is 40.24 kg/m.  Recommend weight loss outpatient with regular physical activity and healthy dieting.    Procedures:  None  Consultations:  None  Discharge Exam: BP 137/69 (BP Location: Left Arm)   Pulse 95   Temp 98.1 F (36.7 C) (Oral)   Resp (!) 22   Ht 5\' 2"  (1.575 m)   Wt 99.8 kg  SpO2 96%   BMI 40.24 kg/m  . General: 74 y.o. year-old male well developed well nourished in no acute distress.  Alert and oriented x3. . Cardiovascular: Regular rate and rhythm with no rubs or gallops.  No thyromegaly or JVD noted.   Marland Kitchen Respiratory: Mild rales at bases no wheezing noted.  Good inspiratory effort.   . Abdomen: Soft obese normal  bowel sounds present.  Nontender.   . Musculoskeletal: No lower extremity edema bilaterally.   Marland Kitchen Psychiatry: Mood is appropriate for condition and setting  Discharge Instructions You were cared for by a hospitalist during your hospital stay. If you have any questions about your discharge medications or the care you received while you were in the hospital after you are discharged, you can call the unit and asked to speak with the hospitalist on call if the hospitalist that took care of you is not available. Once you are discharged, your primary care physician will handle any further medical issues. Please note that NO REFILLS for any discharge medications will be authorized once you are discharged, as it is imperative that you return to your primary care physician (or establish a relationship with a primary care physician if you do not have one) for your aftercare needs so that they can reassess your need for medications and monitor your lab values.  Discharge Instructions    May discharge patient home 2 hours after infusion if patient did not have any reaction requiring intervention   Complete by: Dec 09, 2020    Remdesivir infusion need for 12/20 and 12/10/20.     Allergies as of 12/08/2020   No Known Allergies     Medication List    STOP taking these medications   Advair Diskus 250-50 MCG/DOSE Aepb Generic drug: Fluticasone-Salmeterol   Combivent Respimat 20-100 MCG/ACT Aers respimat Generic drug: Ipratropium-Albuterol   losartan-hydrochlorothiazide 100-12.5 MG tablet Commonly known as: HYZAAR     TAKE these medications   acetaminophen 325 MG tablet Commonly known as: TYLENOL Take 2 tablets (650 mg total) by mouth every 6 (six) hours as needed for mild pain (or Fever >/= 101).   albuterol 108 (90 Base) MCG/ACT inhaler Commonly known as: VENTOLIN HFA Inhale 2 puffs into the lungs every 6 (six) hours as needed for wheezing or shortness of breath.   amLODipine 2.5 MG  tablet Commonly known as: NORVASC Take 2.5 mg by mouth daily.   apixaban 2.5 MG Tabs tablet Commonly known as: ELIQUIS Take 1 tablet (2.5 mg total) by mouth 2 (two) times daily.   ascorbic acid 500 MG tablet Commonly known as: VITAMIN C Take 1 tablet (500 mg total) by mouth daily. Start taking on: December 09, 2020   aspirin 81 MG tablet Take 81 mg by mouth daily.   doxazosin 8 MG tablet Commonly known as: CARDURA Take 8 mg by mouth every evening.   finasteride 5 MG tablet Commonly known as: PROSCAR Take 5 mg by mouth every evening.   GARLIC PO Take 1 capsule by mouth daily.   ipratropium 0.02 % nebulizer solution Commonly known as: ATROVENT Take 0.5 mg by nebulization every 6 (six) hours as needed for wheezing or shortness of breath.   loperamide 2 MG capsule Commonly known as: IMODIUM Take 1 capsule (2 mg total) by mouth as needed for diarrhea or loose stools.   montelukast 10 MG tablet Commonly known as: SINGULAIR Take 10 mg by mouth daily.   MULTIVITAMIN ADULT PO Take 1 tablet by mouth daily.  predniSONE 5 MG tablet Commonly known as: DELTASONE Take 2 tablets (10 mg total) by mouth daily. Take 40 mg daily x2 days, then, Take 30 mg daily x2 days, then, Take 20 mg daily x2 days, then, Take 10 mg daily x2 days, then, Take 5 mg daily x2 days, then, stop. What changed:   medication strength  additional instructions   Symbicort 160-4.5 MCG/ACT inhaler Generic drug: budesonide-formoterol Inhale 2 puffs into the lungs daily.   Vitamin D3 25 MCG tablet Commonly known as: Vitamin D Take 1 tablet (1,000 Units total) by mouth daily. Start taking on: December 09, 2020   zinc sulfate 220 (50 Zn) MG capsule Take 1 capsule (220 mg total) by mouth daily. Start taking on: December 09, 2020      No Known Allergies  Follow-up Information    Corine Shelter, MD. Call in 1 day(s).   Specialty: Pulmonary Disease Why: Please call for a post hospital follow-up  appointment. Contact information: [address] [place] [address] [phone]                The results of significant diagnostics from this hospitalization (including imaging, microbiology, ancillary and laboratory) are listed below for reference.    Significant Diagnostic Studies: DG Chest Port 1 View  Result Date: 12/06/2020 CLINICAL DATA:  Short of breath, COVID EXAM: PORTABLE CHEST 1 VIEW COMPARISON:  01/31/2020 FINDINGS: Patchy and streaky perihilar and basilar opacities. Stable cardiomediastinal silhouette with aortic atherosclerosis. No pneumothorax IMPRESSION: Minimal patchy and streaky perihilar and basilar opacities, probable areas of pneumonia. Electronically Signed   By: Jasmine Pang M.D.   On: 12/06/2020 18:31    Microbiology: Recent Results (from the past 240 hour(s))  Resp Panel by RT-PCR (Flu A&B, Covid) Nasopharyngeal Swab     Status: Abnormal   Collection Time: 12/06/20  5:55 PM   Specimen: Nasopharyngeal Swab; Nasopharyngeal(NP) swabs in vial transport medium  Result Value Ref Range Status   SARS Coronavirus 2 by RT PCR POSITIVE (A) NEGATIVE Final    Comment: CRITICAL RESULT CALLED TO, READ BACK BY AND VERIFIED WITH: JEANINE NASH RN 12/06/2020 @2052  BY P.HENDERSON (NOTE) SARS-CoV-2 target nucleic acids are DETECTED.  The SARS-CoV-2 RNA is generally detectable in upper respiratory specimens during the acute phase of infection. Positive results are indicative of the presence of the identified virus, but do not rule out bacterial infection or co-infection with other pathogens not detected by the test. Clinical correlation with patient history and other diagnostic information is necessary to determine patient infection status. The expected result is Negative.  Fact Sheet for Patients:  Fact Sheet for Healthcare Providers: BloggerCourse.com  This test is not yet approved or  cleared by the [place] FDA and  has been authorized for detection and/or diagnosis of SARS-CoV-2 by FDA under an Emergency Use Authorization (EUA).  This EUA will remain in effect (m eaning this test can be used) for the duration of  the COVID-19 declaration under Section 564(b)(1) of the Act, 21 U.S.C. section 360bbb-3(b)(1), unless the authorization is terminated or revoked sooner.     Influenza A by PCR NEGATIVE NEGATIVE Final   Influenza B by PCR NEGATIVE NEGATIVE Final    Comment: (NOTE) The Xpert Xpress SARS-CoV-2/FLU/RSV plus assay is intended as an aid in the diagnosis of influenza from Nasopharyngeal swab specimens and should not be used as a sole basis for treatment. Nasal washings and aspirates are unacceptable for Xpert Xpress SARS-CoV-2/FLU/RSV testing.  Fact Sheet for Patients: [place]  Fact Sheet for Healthcare Providers: SeriousBroker.it  This test is not yet approved or cleared by the [place] FDA and has been authorized for detection and/or diagnosis of SARS-CoV-2 by FDA under an Emergency Use Authorization (EUA). This EUA will remain in effect (meaning this test can be used) for the duration of the COVID-19 declaration under Section 564(b)(1) of the Act, 21 U.S.C. section 360bbb-3(b)(1), unless the authorization is terminated or revoked.  Performed at [facility], [address] [place], [place] [address]      Labs: Basic Metabolic Panel: Recent Labs  Lab 12/06/20 1754 12/07/20 0628 12/08/20 0303  NA 135 137 140  K 4.9 4.8 4.4  CL 101 104 107  CO2 21* 19* 21*  GLUCOSE 109* 147* 176*  BUN 50* 42* 49*  CREATININE 1.85* 1.45* 1.30*  CALCIUM 8.3* 8.2* 8.4*  MG  --  2.3 2.3   Liver Function Tests: Recent Labs  Lab 12/06/20 1754 12/07/20 0628 12/08/20 0303  AST 35 33 21  ALT 26 30 28   ALKPHOS 48 49 47  BILITOT 0.5 0.4 0.5  PROT 7.1 7.1 6.6  ALBUMIN  3.3* 3.1* 3.0*   No results for input(s): LIPASE, AMYLASE in the last 168 hours. No results for input(s): AMMONIA in the last 168 hours. CBC: Recent Labs  Lab 12/06/20 1754 12/07/20 0628 12/08/20 0303  WBC 6.4 7.0 5.6  NEUTROABS 5.0 6.3 4.4  HGB 12.3* 12.4* 12.2*  HCT 38.9* 39.3 39.3  MCV 81.4 81.9 81.7  PLT 226 237 246   Cardiac Enzymes: No results for input(s): CKTOTAL, CKMB, CKMBINDEX, TROPONINI in the last 168 hours. BNP: BNP (last 3 results) Recent Labs    01/31/20 1733  BNP 28.8    ProBNP (last 3 results) No results for input(s): PROBNP in the last 8760 hours.  CBG: No results for input(s): GLUCAP in the last 168 hours.     Signed:  03/30/20, MD [facility] 12/08/2020, 2:38 PM

## 2020-12-08 NOTE — Progress Notes (Signed)
SATURATION QUALIFICATIONS: (This note is used to comply with regulatory documentation for home oxygen)  Patient Saturations on Room Air at Rest = 91%  Patient Saturations on Room Air while Ambulating = 90%  Please briefly explain why patient needs home oxygen: Patient desats mildly to 89% while ambulating but recovers back to 90's quickly. Patient will not need home oxygen.

## 2020-12-08 NOTE — Plan of Care (Signed)
  Problem: Respiratory: Goal: Will maintain a patent airway Outcome: Progressing   

## 2020-12-08 NOTE — Discharge Instructions (Addendum)
You are scheduled for a Remdesivir infusion on 12/21 at 1030. Please come to [facility], you will see a COVID infusion banner by the road.  Enter there and turn left.  There are marked spaces for Infusion. Call the number on the sign or [phone] and someone will come out and bring you inside. If someone is driving you please come to the same area and call the number and someone will come outside to get you. Thank you!      10 Things You Can Do to Manage Your COVID-19 Symptoms at Home If you have possible or confirmed COVID-19: 1. Stay home from work and school. And stay away from other public places. If you must go out, avoid using any kind of public transportation, ridesharing, or taxis. 2. Monitor your symptoms carefully. If your symptoms get worse, call your healthcare provider immediately. 3. Get rest and stay hydrated. 4. If you have a medical appointment, call the healthcare provider ahead of time and tell them that you have or may have COVID-19. 5. For medical emergencies, call 911 and notify the dispatch personnel that you have or may have COVID-19. 6. Cover your cough and sneezes with a tissue or use the inside of your elbow. 7. Wash your hands often with soap and water for at least 20 seconds or clean your hands with an alcohol-based hand sanitizer that contains at least 60% alcohol. 8. As much as possible, stay in a specific room and away from other people in your home. Also, you should use a separate bathroom, if available. If you need to be around other people in or outside of the home, wear a mask. 9. Avoid sharing personal items with other people in your household, like dishes, towels, and bedding. 10. Clean all surfaces that are touched often, like counters, tabletops, and doorknobs. Use household cleaning sprays or wipes according to the label instructions. SouthAmericaFlowers.co.uk 06/21/2019 This information is not intended to replace advice given to you by your health care  provider. Make sure you discuss any questions you have with your health care provider. Document Revised: 11/23/2019 Document Reviewed: 11/23/2019 Elsevier Patient Education  2020 [employer].   COVID-19 Frequently Asked Questions COVID-19 (coronavirus disease) is an infection that is caused by a large family of viruses. Some viruses cause illness in people and others cause illness in animals like camels, cats, and bats. In some cases, the viruses that cause illness in animals can spread to humans. Where did the coronavirus come from? In December 2019, [place] told the [employer] [facility]) of several cases of lung disease (human respiratory illness). These cases were linked to an open seafood and livestock market in the city of [place]. The link to the seafood and livestock market suggests that the virus may have spread from animals to humans. However, since that first outbreak in December, the virus has also been shown to spread from person to person. What is the name of the disease and the virus? Disease name Early on, this disease was called novel coronavirus. This is because scientists determined that the disease was caused by a new (novel) respiratory virus. The World Health Organization [facility]) has now named the disease COVID-19, or coronavirus disease. Virus name The virus that causes the disease is called severe acute respiratory syndrome coronavirus 2 (SARS-CoV-2). More information on disease and virus naming World Health Organization [facility]): www.who.int/emergencies/diseases/novel-coronavirus-2019/technical-guidance/naming-the-coronavirus-disease-(covid-2019)-and-the-virus-that-causes-it Who is at risk for complications from coronavirus disease? Some people may be at higher risk for  complications from coronavirus disease. This includes older adults and people who have chronic diseases, such as heart disease, diabetes, and lung disease. If you are at higher risk for complications, take  these extra precautions:  Stay home as much as possible.  Avoid social gatherings and travel.  Avoid close contact with others. Stay at least 6 ft (2 m) away from others, if possible.  Wash your hands often with soap and water for at least 20 seconds.  Avoid touching your face, mouth, nose, or eyes.  Keep supplies on hand at home, such as food, medicine, and cleaning supplies.  If you must go out in public, wear a cloth face covering or face mask. Make sure your mask covers your nose and mouth. How does coronavirus disease spread? The virus that causes coronavirus disease spreads easily from person to person (is contagious). You may catch the virus by:  Breathing in droplets from an infected person. Droplets can be spread by a person breathing, speaking, singing, coughing, or sneezing.  Touching something, like a table or a doorknob, that was exposed to the virus (contaminated) and then touching your mouth, nose, or eyes. Can I get the virus from touching surfaces or objects? There is still a lot that we do not know about the virus that causes coronavirus disease. Scientists are basing a lot of information on what they know about similar viruses, such as:  Viruses cannot generally survive on surfaces for long. They need a human body (host) to survive.  It is more likely that the virus is spread by close contact with people who are sick (direct contact), such as through: ? Shaking hands or hugging. ? Breathing in respiratory droplets that travel through the air. Droplets can be spread by a person breathing, speaking, singing, coughing, or sneezing.  It is less likely that the virus is spread when a person touches a surface or object that has the virus on it (indirect contact). The virus may be able to enter the body if the person touches a surface or object and then touches his or her face, eyes, nose, or mouth. Can a person spread the virus without having symptoms of the disease? It  may be possible for the virus to spread before a person has symptoms of the disease, but this is most likely not the main way the virus is spreading. It is more likely for the virus to spread by being in close contact with people who are sick and breathing in the respiratory droplets spread by a person breathing, speaking, singing, coughing, or sneezing. What are the symptoms of coronavirus disease? Symptoms vary from person to person and can range from mild to severe. Symptoms may include:  Fever or chills.  Cough.  Difficulty breathing or feeling short of breath.  Headaches, body aches, or muscle aches.  Runny or stuffy (congested) nose.  Sore throat.  New loss of taste or smell.  Nausea, vomiting, or diarrhea. These symptoms can appear anywhere from 2 to 14 days after you have been exposed to the virus. Some people may not have any symptoms. If you develop symptoms, call your health care provider. People with severe symptoms may need hospital care. Should I be tested for this virus? Your health care provider will decide whether to test you based on your symptoms, history of exposure, and your risk factors. How does a health care provider test for this virus? Health care providers will collect samples to send for testing. Samples may include:  Taking a swab of fluid from the back of your nose and throat, your nose, or your throat.  Taking fluid from the lungs by having you cough up mucus (sputum) into a sterile cup.  Taking a blood sample. Is there a treatment or vaccine for this virus? Currently, there is no vaccine to prevent coronavirus disease. Also, there are no medicines like antibiotics or antivirals to treat the virus. A person who becomes sick is given supportive care, which means rest and fluids. A person may also relieve his or her symptoms by using over-the-counter medicines that treat sneezing, coughing, and runny nose. These are the same medicines that a person takes  for the common cold. If you develop symptoms, call your health care provider. People with severe symptoms may need hospital care. What can I do to protect myself and my family from this virus?     You can protect yourself and your family by taking the same actions that you would take to prevent the spread of other viruses. Take the following actions:  Wash your hands often with soap and water for at least 20 seconds. If soap and water are not available, use alcohol-based hand sanitizer.  Avoid touching your face, mouth, nose, or eyes.  Cough or sneeze into a tissue, sleeve, or elbow. Do not cough or sneeze into your hand or the air. ? If you cough or sneeze into a tissue, throw it away immediately and wash your hands.  Disinfect objects and surfaces that you frequently touch every day.  Stay away from people who are sick.  Avoid going out in public, follow guidance from your state and local health authorities.  Avoid crowded indoor spaces. Stay at least 6 ft (2 m) away from others.  If you must go out in public, wear a cloth face covering or face mask. Make sure your mask covers your nose and mouth.  Stay home if you are sick, except to get medical care. Call your health care provider before you get medical care. Your health care provider will tell you how long to stay home.  Make sure your vaccines are up to date. Ask your health care provider what vaccines you need. What should I do if I need to travel? Follow travel recommendations from your local health authority, the CDC, and WHO. Travel information and advice  Centers for Disease Control and Prevention (CDC): GeminiCard.gl  World Health Organization [facility]): PreviewDomains.se Know the risks and take action to protect your health  You are at higher risk of getting coronavirus disease if you are traveling to areas with an outbreak or if  you are exposed to travelers from areas with an outbreak.  Wash your hands often and practice good hygiene to lower the risk of catching or spreading the virus. What should I do if I am sick? General instructions to stop the spread of infection  Wash your hands often with soap and water for at least 20 seconds. If soap and water are not available, use alcohol-based hand sanitizer.  Cough or sneeze into a tissue, sleeve, or elbow. Do not cough or sneeze into your hand or the air.  If you cough or sneeze into a tissue, throw it away immediately and wash your hands.  Stay home unless you must get medical care. Call your health care provider or local health authority before you get medical care.  Avoid public areas. Do not take public transportation, if possible.  If you can, wear a mask if  you must go out of the house or if you are in close contact with someone who is not sick. Make sure your mask covers your nose and mouth. Keep your home clean  Disinfect objects and surfaces that are frequently touched every day. This may include: ? Counters and tables. ? Doorknobs and light switches. ? Sinks and faucets. ? Electronics such as phones, remote controls, keyboards, computers, and tablets.  Wash dishes in hot, soapy water or use a dishwasher. Air-dry your dishes.  Wash laundry in hot water. Prevent infecting other household members  Let healthy household members care for children and pets, if possible. If you have to care for children or pets, wash your hands often and wear a mask.  Sleep in a different bedroom or bed, if possible.  Do not share personal items, such as razors, toothbrushes, deodorant, combs, brushes, towels, and washcloths. Where to find more information Centers for Disease Control and Prevention (CDC)  Information and news updates: CardRetirement.czwww.cdc.gov/coronavirus/2019-ncov World Health Organization [facility](WHO)  Information and news updates:  AffordableSalon.eswww.who.int/emergencies/diseases/novel-coronavirus-2019  Coronavirus health topic: https://thompson-craig.com/www.who.int/health-topics/coronavirus  Questions and answers on COVID-19: kruiseway.comwww.who.int/news-room/q-a-detail/q-a-coronaviruses  Global tracker: who.sprinklr.com American Academy of Pediatrics (AAP)  Information for families: www.healthychildren.org/English/health-issues/conditions/chest-lungs/Pages/2019-Novel-Coronavirus.aspx The coronavirus situation is changing rapidly. Check your local health authority website or the CDC and [facility] websites for updates and news. When should I contact a health care provider?  Contact your health care provider if you have symptoms of an infection, such as fever or cough, and you: ? Have been near anyone who is known to have coronavirus disease. ? Have come into contact with a person who is suspected to have coronavirus disease. ? Have traveled to an area where there is an outbreak of COVID-19. When should I get emergency medical care?  Get help right away by calling your local emergency services (911 in the [place] if you have: ? Trouble breathing. ? Pain or pressure in your chest. ? Confusion. ? Blue-tinged lips and fingernails. ? Difficulty waking from sleep. ? Symptoms that get worse. Let the emergency medical personnel know if you think you have coronavirus disease. Summary  A new respiratory virus is spreading from person to person and causing COVID-19 (coronavirus disease).  The virus that causes COVID-19 appears to spread easily. It spreads from one person to another through droplets from breathing, speaking, singing, coughing, or sneezing.  Older adults and those with chronic diseases are at higher risk of disease. If you are at higher risk for complications, take extra precautions.  There is currently no vaccine to prevent coronavirus disease. There are no medicines, such as antibiotics or antivirals, to treat the virus.  You can protect yourself and your family  by washing your hands often, avoiding touching your face, and covering your coughs and sneezes. This information is not intended to replace advice given to you by your health care provider. Make sure you discuss any questions you have with your health care provider. Document Revised: 10/06/2019 Document Reviewed: 04/04/2019 Elsevier Patient Education  2020 Elsevier Inc.  COVID-19: How to Protect Yourself and Others Know how it spreads  There is currently no vaccine to prevent coronavirus disease 2019 (COVID-19).  The best way to prevent illness is to avoid being exposed to this virus.  The virus is thought to spread mainly from person-to-person. ? Between people who are in close contact with one another (within about 6 feet). ? Through respiratory droplets produced when an infected person coughs, sneezes or talks. ? These droplets can land  in the mouths or noses of people who are nearby or possibly be inhaled into the lungs. ? COVID-19 may be spread by people who are not showing symptoms. Everyone should Clean your hands often  Wash your hands often with soap and water for at least 20 seconds especially after you have been in a public place, or after blowing your nose, coughing, or sneezing.  If soap and water are not readily available, use a hand sanitizer that contains at least 60% alcohol. Cover all surfaces of your hands and rub them together until they feel dry.  Avoid touching your eyes, nose, and mouth with unwashed hands. Avoid close contact  Limit contact with others as much as possible.  Avoid close contact with people who are sick.  Put distance between yourself and other people. ? Remember that some people without symptoms may be able to spread virus. ? This is especially important for people who are at higher risk of getting very RetroStamps.it Cover your mouth and nose with a mask when around  others  You could spread COVID-19 to others even if you do not feel sick.  Everyone should wear a mask in public settings and when around people not living in their household, especially when social distancing is difficult to maintain. ? Masks should not be placed on young children under age 60, anyone who has trouble breathing, or is unconscious, incapacitated or otherwise unable to remove the mask without assistance.  The mask is meant to protect other people in case you are infected.  Do NOT use a facemask meant for a Research scientist (physical sciences).  Continue to keep about 6 feet between yourself and others. The mask is not a substitute for social distancing. Cover coughs and sneezes  Always cover your mouth and nose with a tissue when you cough or sneeze or use the inside of your elbow.  Throw used tissues in the trash.  Immediately wash your hands with soap and water for at least 20 seconds. If soap and water are not readily available, clean your hands with a hand sanitizer that contains at least 60% alcohol. Clean and disinfect  Clean AND disinfect frequently touched surfaces daily. This includes tables, doorknobs, light switches, countertops, handles, desks, phones, keyboards, toilets, faucets, and sinks. ktimeonline.com  If surfaces are dirty, clean them: Use detergent or soap and water prior to disinfection.  Then, use a household disinfectant. You can see a list of EPA-registered household disinfectants here. SouthAmericaFlowers.co.uk 08/23/2019 This information is not intended to replace advice given to you by your health care provider. Make sure you discuss any questions you have with your health care provider. Document Revised: 08/31/2019 Document Reviewed: 06/29/2019 Elsevier Patient Education  2020 Elsevier Inc.  Prevent the Spread of COVID-19 if You Are Sick If you are sick with COVID-19 or think you might have COVID-19,  follow the steps below to care for yourself and to help protect other people in your home and community. Stay home except to get medical care.  Stay home. Most people with COVID-19 have mild illness and are able to recover at home without medical care. Do not leave your home, except to get medical care. Do not visit public areas.  Take care of yourself. Get rest and stay hydrated. Take over-the-counter medicines, such as acetaminophen, to help you feel better.  Stay in touch with your doctor. Call before you get medical care. Be sure to get care if you have trouble breathing, or have any other emergency warning  signs, or if you think it is an emergency.  Avoid public transportation, ride-sharing, or taxis. Separate yourself from other people and pets in your home.  As much as possible, stay in a specific room and away from other people and pets in your home. Also, you should use a separate bathroom, if available. If you need to be around other people or animals in or outside of the home, wear a mask. ? See COVID-19 and Animals if you have questions about AnniversaryBlowout.com.ee. ? Additional guidance is available for those living in close quarters. (http://white.info/.html) and shared housing (DisasterTalk.co.uk). Monitor your symptoms.  Symptoms of COVID-19 include fever, cough, and shortness of breath but other symptoms may be present as well.  Follow care instructions from your healthcare provider and local health department. Your local health authorities will give instructions on checking your symptoms and reporting information. When to Seek Emergency Medical Attention Look for emergency warning signs* for COVID-19. If someone is showing any of these signs, seek emergency medical care immediately:  Trouble  breathing  Persistent pain or pressure in the chest  New confusion  Bluish lips or face  Inability to wake or stay awake *This list is not all possible symptoms. Please call your medical provider for any other symptoms that are severe or concerning to you. Call 911 or call ahead to your local emergency facility: Notify the operator that you are seeking care for someone who has or may have COVID-19. Call ahead before visiting your doctor.  Call ahead. Many medical visits for routine care are being postponed or done by phone or telemedicine.  If you have a medical appointment that cannot be postponed, call your doctor's office, and tell them you have or may have COVID-19. If you are sick, wear a mask over your nose and mouth.  You should wear a mask over your nose and mouth if you must be around other people or animals, including pets (even at home).  You don't need to wear the mask if you are alone. If you can't put on a mask (because of trouble breathing for example), cover your coughs and sneezes in some other way. Try to stay at least 6 feet away from other people. This will help protect the people around you.  Masks should not be placed on young children under age 38 years, anyone who has trouble breathing, or anyone who is not able to remove the mask without help. Note: During the COVID-19 pandemic, medical grade facemasks are reserved for healthcare workers and some first responders. You may need to make a mask using a scarf or bandana. Cover your coughs and sneezes.  Cover your mouth and nose with a tissue when you cough or sneeze.  Throw used tissues in a lined trash can.  Immediately wash your hands with soap and water for at least 20 seconds. If soap and water are not available, clean your hands with an alcohol-based hand sanitizer that contains at least 60% alcohol. Clean your hands often.  Wash your hands often with soap and water for at least 20 seconds. This is especially  important after blowing your nose, coughing, or sneezing; going to the bathroom; and before eating or preparing food.  Use hand sanitizer if soap and water are not available. Use an alcohol-based hand sanitizer with at least 60% alcohol, covering all surfaces of your hands and rubbing them together until they feel dry.  Soap and water are the best option, especially if your hands are visibly  dirty.  Avoid touching your eyes, nose, and mouth with unwashed hands. Avoid sharing personal household items.  Do not share dishes, drinking glasses, cups, eating utensils, towels, or bedding with other people in your home.  Wash these items thoroughly after using them with soap and water or put them in the dishwasher. Clean all "high-touch" surfaces everyday.  Clean and disinfect high-touch surfaces in your "sick room" and bathroom. Let someone else clean and disinfect surfaces in common areas, but not your bedroom and bathroom.  If a caregiver or other person needs to clean and disinfect a sick person's bedroom or bathroom, they should do so on an as-needed basis. The caregiver/other person should wear a mask and wait as long as possible after the sick person has used the bathroom. High-touch surfaces include phones, remote controls, counters, tabletops, doorknobs, bathroom fixtures, toilets, keyboards, tablets, and bedside tables.  Clean and disinfect areas that may have blood, stool, or body fluids on them.  Use household cleaners and disinfectants. Clean the area or item with soap and water or another detergent if it is dirty. Then use a household disinfectant. ? Be sure to follow the instructions on the label to ensure safe and effective use of the product. Many products recommend keeping the surface wet for several minutes to ensure germs are killed. Many also recommend precautions such as wearing gloves and making sure you have good ventilation during use of the product. ? Most EPA-registered  household disinfectants should be effective. When you can be around others after you had or likely had COVID-19 When you can be around others (end home isolation) depends on different factors for different situations.  I think or know I had COVID-19, and I had symptoms ? You can be with others after  24 hours with no fever AND  Symptoms improved AND  10 days since symptoms first appeared ? Depending on your healthcare provider's advice and availability of testing, you might get tested to see if you still have COVID-19. If you will be tested, you can be around others when you have no fever, symptoms have improved, and you receive two negative test results in a row, at least 24 hours apart.  I tested positive for COVID-19 but had no symptoms ? If you continue to have no symptoms, you can be with others after:  10 days have passed since test ? Depending on your healthcare provider's advice and availability of testing, you might get tested to see if you still have COVID-19. If you will be tested, you can be around others after you receive two negative test results in a row, at least 24 hours apart. ? If you develop symptoms after testing positive, follow the guidance above for "I think or know I had COVID, and I had symptoms." [email or web address] 08/01/2019 This information is not intended to replace advice given to you by your health care provider. Make sure you discuss any questions you have with your health care provider. Document Revised: 08/17/2019 Document Reviewed: 06/20/2019 Elsevier Patient Education  2020 Elsevier Inc.  COVID-19: Quarantine vs. Isolation QUARANTINE keeps someone who was in close contact with someone who has COVID-19 away from others. If you had close contact with a person who has COVID-19  Stay home until 14 days after your last contact.  Check your temperature twice a day and watch for symptoms of COVID-19.  If possible, stay away from people who are at  higher-risk for getting very sick from COVID-19. ISOLATION keeps someone who  is sick or tested positive for COVID-19 without symptoms away from others, even in their own home. If you are sick and think or know you have COVID-19  Stay home until after ? At least 10 days since symptoms first appeared and ? At least 24 hours with no fever without fever-reducing medication and ? Symptoms have improved If you tested positive for COVID-19 but do not have symptoms  Stay home until after ? 10 days have passed since your positive test If you live with others, stay in a specific "sick room" or area and away from other people or animals, including pets. Use a separate bathroom, if available. [email or web address] 07/10/2019 This information is not intended to replace advice given to you by your health care provider. Make sure you discuss any questions you have with your health care provider. Document Revised: 11/23/2019 Document Reviewed: 11/23/2019 Elsevier Patient Education  2020 [employer].

## 2020-12-08 NOTE — Progress Notes (Addendum)
Patient will be discharging with family later this afternoon. His belongings were returned and education on medications were provided.

## 2020-12-09 ENCOUNTER — Inpatient Hospital Stay (HOSPITAL_COMMUNITY): Payer: Managed Care, Other (non HMO)

## 2020-12-09 DIAGNOSIS — R7989 Other specified abnormal findings of blood chemistry: Secondary | ICD-10-CM

## 2020-12-09 LAB — CBC WITH DIFFERENTIAL/PLATELET
Abs Immature Granulocytes: 0.47 10*3/uL — ABNORMAL HIGH (ref 0.00–0.07)
Basophils Absolute: 0 10*3/uL (ref 0.0–0.1)
Basophils Relative: 0 %
Eosinophils Absolute: 0 10*3/uL (ref 0.0–0.5)
Eosinophils Relative: 0 %
HCT: 40.6 % (ref 39.0–52.0)
Hemoglobin: 12.8 g/dL — ABNORMAL LOW (ref 13.0–17.0)
Immature Granulocytes: 4 %
Lymphocytes Relative: 5 %
Lymphs Abs: 0.5 10*3/uL — ABNORMAL LOW (ref 0.7–4.0)
MCH: 25.8 pg — ABNORMAL LOW (ref 26.0–34.0)
MCHC: 31.5 g/dL (ref 30.0–36.0)
MCV: 81.7 fL (ref 80.0–100.0)
Monocytes Absolute: 0.8 10*3/uL (ref 0.1–1.0)
Monocytes Relative: 7 %
Neutro Abs: 9.1 10*3/uL — ABNORMAL HIGH (ref 1.7–7.7)
Neutrophils Relative %: 84 %
Platelets: 301 10*3/uL (ref 150–400)
RBC: 4.97 MIL/uL (ref 4.22–5.81)
RDW: 15.8 % — ABNORMAL HIGH (ref 11.5–15.5)
WBC: 10.9 10*3/uL — ABNORMAL HIGH (ref 4.0–10.5)
nRBC: 0 % (ref 0.0–0.2)

## 2020-12-09 LAB — D-DIMER, QUANTITATIVE: D-Dimer, Quant: 1.85 ug/mL-FEU — ABNORMAL HIGH (ref 0.00–0.50)

## 2020-12-09 LAB — COMPREHENSIVE METABOLIC PANEL
ALT: 27 U/L (ref 0–44)
AST: 19 U/L (ref 15–41)
Albumin: 3.1 g/dL — ABNORMAL LOW (ref 3.5–5.0)
Alkaline Phosphatase: 50 U/L (ref 38–126)
Anion gap: 13 (ref 5–15)
BUN: 48 mg/dL — ABNORMAL HIGH (ref 8–23)
CO2: 22 mmol/L (ref 22–32)
Calcium: 8.9 mg/dL (ref 8.9–10.3)
Chloride: 109 mmol/L (ref 98–111)
Creatinine, Ser: 1.29 mg/dL — ABNORMAL HIGH (ref 0.61–1.24)
GFR, Estimated: 59 mL/min — ABNORMAL LOW (ref >60)
Glucose, Bld: 157 mg/dL — ABNORMAL HIGH (ref 70–99)
Potassium: 4.5 mmol/L (ref 3.5–5.1)
Sodium: 144 mmol/L (ref 135–145)
Total Bilirubin: 0.5 mg/dL (ref 0.3–1.2)
Total Protein: 6.6 g/dL (ref 6.5–8.1)

## 2020-12-09 LAB — MAGNESIUM: Magnesium: 2.2 mg/dL (ref 1.7–2.4)

## 2020-12-09 LAB — C-REACTIVE PROTEIN: CRP: 3 mg/dL — ABNORMAL HIGH (ref <1.0)

## 2020-12-09 MED ORDER — FUROSEMIDE 10 MG/ML IJ SOLN
20.0000 mg | Freq: Once | INTRAMUSCULAR | Status: AC
  Administered 2020-12-09: 09:00:00 20 mg via INTRAVENOUS
  Filled 2020-12-09: qty 2

## 2020-12-09 NOTE — Progress Notes (Signed)
The patient is scheduled for a Remdesivir infusion on 12/21 at 1030AM. Have the patient come to [address] [facility], they will see a COVID infusion banner by the road.  Enter there and turn left. There are marked spaces for Infusion.  Call the number on the sign or [phone] and someone will come out and bring them inside.  If someone is driving them, have them come to the same area and call the number and someone will come outside to get them. Thank you!

## 2020-12-09 NOTE — Progress Notes (Signed)
Bilateral lower extremity venous duplex has been completed. Preliminary results can be found in CV Proc through chart review.   12/09/20 11:41 AM Olen Cordial RVT

## 2020-12-09 NOTE — Plan of Care (Signed)
  Problem: Education: Goal: Knowledge of risk factors and measures for prevention of condition will improve Outcome: Progressing   Problem: Coping: Goal: Psychosocial and spiritual needs will be supported Outcome: Progressing   

## 2020-12-09 NOTE — Discharge Summary (Signed)
Discharge Summary  Bryan Fitzgerald NGE:[id number] DOB: 1946-10-04  PCP: Corine Shelter, MD  Admit date: 12/06/2020 Discharge date: 12/09/2020  Time spent: 35 minutes  Recommendations for Outpatient Follow-up:  1. Follow-up with your PCP 2. Follow-up with your pulmonologist 3. Take your medications as prescribed 4. Please quarantine for 14 days from 12/06/2020.   Discharge Diagnoses:  Active Hospital Problems   Diagnosis Date Noted  . COVID-19 virus infection 12/06/2020  . AKI (acute kidney injury) (HCC) 12/07/2020  . Pneumonia due to COVID-19 virus 12/07/2020  . Mild intermittent asthma with acute exacerbation   . Benign prostatic hyperplasia 11/07/2013  . Essential hypertension 11/07/2013    Resolved Hospital Problems  No resolved problems to display.    Discharge Condition: Stable  Diet recommendation: Resume previous diet.  Vitals:   12/08/20 2050 12/09/20 0442  BP: (!) 146/77 (!) 142/84  Pulse: 89 90  Resp: 18 19  Temp: 97.8 F (36.6 C) 98.3 F (36.8 C)  SpO2: 95% 96%    History of present illness:  74 year old male with past medical history of mild intermittent asthma, hypertension, benign prostatic hyperplasia [facility] from local urgent care clinic office due to hypoxia.  Symptoms started approximately 1 week ago.  Found to be positive for COVID-19 pneumonia.  Patient is unvaccinated. He was started on remdesivir and steroid.  Passed home O2 evaluation on 12/08/2020: SATURATION QUALIFICATIONS: (This note is used to comply with regulatory documentation for home oxygen)  Patient Saturations on Room Air at Rest = 91%  Patient Saturations on Room Air while Ambulating = 90%  Please briefly explain why patient needs home oxygen: Patient desats mildly to 89% while ambulating but recovers back to 90's quickly. Patient will not need home oxygen.   Drema Dallas, LPN  Licensed Practical Nurse   Nursing   12/09/20: Seen and examined at bedside.  He has no new complaints.  No acute events overnight.  Eager to go home.   Hospital Course:  Principal Problem:   COVID-19 virus infection Active Problems:   Essential hypertension   Benign prostatic hyperplasia   Mild intermittent asthma with acute exacerbation   AKI (acute kidney injury) (HCC)   Pneumonia due to COVID-19 virus  COVID-19 viral pneumonia.   Received 4 days of remdesivir and IV steroids Plan is to return for remaining 1 dose of IV remdesivir.  Continue prednisone steroid taper x10 days. Continue vitamin C, D3 and zinc. Continue bronchodilator and incentive spirometer Start Eliquis 2.5 mg twice daily for DVT prophylaxis.  Resolving AKI, likely prerenal secondary to dehydration  Last creatinine 1.02 on 02/01/2020 with GFR greater than 60. Creatinine downtrending 1.29 from 1.30 from 1.85. Continue to avoid nephrotoxins, hypotension and dehydration. Follow-up with your PCP  BPH. -Continue home dose of Cardura and Proscar.  Hypertension.   BP is at goal  -Continue home dose of amlodipine and Cardura. Holding losartan and HCTZ due to AKI. Follow-up with your PCP.  Severe morbid obesity. Body mass index is 40.24 kg/m.  Recommend weight loss outpatient with regular physical activity and healthy dieting.    Procedures:  None  Consultations:  None  Discharge Exam: BP (!) 142/84 (BP Location: Right Arm)   Pulse 90   Temp 98.3 F (36.8 C) (Oral)   Resp 19   Ht 5\' 2"  (1.575 m)   Wt 99.8 kg   SpO2 96%   BMI 40.24 kg/m  . General: 74 y.o. year-old male well-developed well-nourished  in no acute distress.  Alert and oriented x3.   . Cardiovascular: Regular rate and rhythm no rubs or gallops. Marland Kitchen Respiratory: Clear to auscultation with no wheezing or rales.  . Abdomen: Obese nontender normal bowel sounds present. . Musculoskeletal: Trace lower extremity edema bilaterally. Marland Kitchen Psychiatry: Mood is  appropriate for condition and setting.  Discharge Instructions You were cared for by a hospitalist during your hospital stay. If you have any questions about your discharge medications or the care you received while you were in the hospital after you are discharged, you can call the unit and asked to speak with the hospitalist on call if the hospitalist that took care of you is not available. Once you are discharged, your primary care physician will handle any further medical issues. Please note that NO REFILLS for any discharge medications will be authorized once you are discharged, as it is imperative that you return to your primary care physician (or establish a relationship with a primary care physician if you do not have one) for your aftercare needs so that they can reassess your need for medications and monitor your lab values.  Discharge Instructions    May discharge patient home 2 hours after infusion if patient did not have any reaction requiring intervention   Complete by: Dec 09, 2020    Remdesivir infusion need for 12/20 and 12/10/20.     Allergies as of 12/09/2020   No Known Allergies     Medication List    STOP taking these medications   Advair Diskus 250-50 MCG/DOSE Aepb Generic drug: Fluticasone-Salmeterol   Combivent Respimat 20-100 MCG/ACT Aers respimat Generic drug: Ipratropium-Albuterol   losartan-hydrochlorothiazide 100-12.5 MG tablet Commonly known as: HYZAAR     TAKE these medications   acetaminophen 325 MG tablet Commonly known as: TYLENOL Take 2 tablets (650 mg total) by mouth every 6 (six) hours as needed for mild pain (or Fever >/= 101).   albuterol 108 (90 Base) MCG/ACT inhaler Commonly known as: VENTOLIN HFA Inhale 2 puffs into the lungs every 6 (six) hours as needed for wheezing or shortness of breath.   amLODipine 2.5 MG tablet Commonly known as: NORVASC Take 2.5 mg by mouth daily.   apixaban 2.5 MG Tabs tablet Commonly known as: ELIQUIS Take  1 tablet (2.5 mg total) by mouth 2 (two) times daily.   ascorbic acid 500 MG tablet Commonly known as: VITAMIN C Take 1 tablet (500 mg total) by mouth daily.   aspirin 81 MG tablet Take 81 mg by mouth daily.   doxazosin 8 MG tablet Commonly known as: CARDURA Take 8 mg by mouth every evening.   finasteride 5 MG tablet Commonly known as: PROSCAR Take 5 mg by mouth every evening.   GARLIC PO Take 1 capsule by mouth daily.   ipratropium 0.02 % nebulizer solution Commonly known as: ATROVENT Take 0.5 mg by nebulization every 6 (six) hours as needed for wheezing or shortness of breath.   loperamide 2 MG capsule Commonly known as: IMODIUM Take 1 capsule (2 mg total) by mouth as needed for diarrhea or loose stools.   montelukast 10 MG tablet Commonly known as: SINGULAIR Take 10 mg by mouth daily.   MULTIVITAMIN ADULT PO Take 1 tablet by mouth daily.   predniSONE 5 MG tablet Commonly known as: DELTASONE Take 2 tablets (10 mg total) by mouth daily. Take 40 mg daily x2 days, then, Take 30 mg daily x2 days, then, Take 20 mg daily x2 days, then, Take 10  mg daily x2 days, then, Take 5 mg daily x2 days, then, stop. What changed:   medication strength  additional instructions   Symbicort 160-4.5 MCG/ACT inhaler Generic drug: budesonide-formoterol Inhale 2 puffs into the lungs daily.   Vitamin D3 25 MCG tablet Commonly known as: Vitamin D Take 1 tablet (1,000 Units total) by mouth daily.   zinc sulfate 220 (50 Zn) MG capsule Take 1 capsule (220 mg total) by mouth daily.      No Known Allergies  Follow-up Information    Corine ShelterKilpatrick, George, MD. Call in 1 day(s).   Specialty: Pulmonary Disease Why: Please call for a post hospital follow-up appointment. Contact information: [address] [place] [address] [phone]                The results of significant diagnostics from this hospitalization (including imaging, microbiology, ancillary and  laboratory) are listed below for reference.    Significant Diagnostic Studies: DG Chest Port 1 View  Result Date: 12/06/2020 CLINICAL DATA:  Short of breath, COVID EXAM: PORTABLE CHEST 1 VIEW COMPARISON:  01/31/2020 FINDINGS: Patchy and streaky perihilar and basilar opacities. Stable cardiomediastinal silhouette with aortic atherosclerosis. No pneumothorax IMPRESSION: Minimal patchy and streaky perihilar and basilar opacities, probable areas of pneumonia. Electronically Signed   By: Jasmine PangKim  Fujinaga M.D.   On: 12/06/2020 18:31    Microbiology: Recent Results (from the past 240 hour(s))  Resp Panel by RT-PCR (Flu A&B, Covid) Nasopharyngeal Swab     Status: Abnormal   Collection Time: 12/06/20  5:55 PM   Specimen: Nasopharyngeal Swab; Nasopharyngeal(NP) swabs in vial transport medium  Result Value Ref Range Status   SARS Coronavirus 2 by RT PCR POSITIVE (A) NEGATIVE Final    Comment: CRITICAL RESULT CALLED TO, READ BACK BY AND VERIFIED WITH: JEANINE NASH RN 12/06/2020 @2052  BY P.HENDERSON (NOTE) SARS-CoV-2 target nucleic acids are DETECTED.  The SARS-CoV-2 RNA is generally detectable in upper respiratory specimens during the acute phase of infection. Positive results are indicative of the presence of the identified virus, but do not rule out bacterial infection or co-infection with other pathogens not detected by the test. Clinical correlation with patient history and other diagnostic information is necessary to determine patient infection status. The expected result is Negative.  Fact Sheet for Patients: BloggerCourse.comhttps://www.fda.gov/media/152166/download  Fact Sheet for Healthcare Providers: SeriousBroker.ithttps://www.fda.gov/media/152162/download  This test is not yet approved or cleared by the [place] FDA and  has been authorized for detection and/or diagnosis of SARS-CoV-2 by FDA under an Emergency Use Authorization (EUA).  This EUA will remain in effect (m eaning this test can be used) for the  duration of  the COVID-19 declaration under Section 564(b)(1) of the Act, 21 U.S.C. section 360bbb-3(b)(1), unless the authorization is terminated or revoked sooner.     Influenza A by PCR NEGATIVE NEGATIVE Final   Influenza B by PCR NEGATIVE NEGATIVE Final    Comment: (NOTE) The Xpert Xpress SARS-CoV-2/FLU/RSV plus assay is intended as an aid in the diagnosis of influenza from Nasopharyngeal swab specimens and should not be used as a sole basis for treatment. Nasal washings and aspirates are unacceptable for Xpert Xpress SARS-CoV-2/FLU/RSV testing.  Fact Sheet for Patients: BloggerCourse.comhttps://www.fda.gov/media/152166/download  Fact Sheet for Healthcare Providers: SeriousBroker.ithttps://www.fda.gov/media/152162/download  This test is not yet approved or cleared by the [place] FDA and has been authorized for detection and/or diagnosis of SARS-CoV-2 by FDA under an Emergency Use Authorization (EUA). This EUA will remain in effect (meaning this test can be used) for  the duration of the COVID-19 declaration under Section 564(b)(1) of the Act, 21 U.S.C. section 360bbb-3(b)(1), unless the authorization is terminated or revoked.  Performed at [facility], [address] [place], [place] [address]      Labs: Basic Metabolic Panel: Recent Labs  Lab 12/06/20 1754 12/07/20 0628 12/08/20 0303 12/09/20 0248  NA 135 137 140 144  K 4.9 4.8 4.4 4.5  CL 101 104 107 109  CO2 21* 19* 21* 22  GLUCOSE 109* 147* 176* 157*  BUN 50* 42* 49* 48*  CREATININE 1.85* 1.45* 1.30* 1.29*  CALCIUM 8.3* 8.2* 8.4* 8.9  MG  --  2.3 2.3 2.2   Liver Function Tests: Recent Labs  Lab 12/06/20 1754 12/07/20 0628 12/08/20 0303 12/09/20 0248  AST 35 33 21 19  ALT 26 30 28 27   ALKPHOS 48 49 47 50  BILITOT 0.5 0.4 0.5 0.5  PROT 7.1 7.1 6.6 6.6  ALBUMIN 3.3* 3.1* 3.0* 3.1*   No results for input(s): LIPASE, AMYLASE in the last 168 hours. No results for input(s): AMMONIA in the last 168  hours. CBC: Recent Labs  Lab 12/06/20 1754 12/07/20 0628 12/08/20 0303 12/09/20 0248  WBC 6.4 7.0 5.6 10.9*  NEUTROABS 5.0 6.3 4.4 9.1*  HGB 12.3* 12.4* 12.2* 12.8*  HCT 38.9* 39.3 39.3 40.6  MCV 81.4 81.9 81.7 81.7  PLT 226 237 246 301   Cardiac Enzymes: No results for input(s): CKTOTAL, CKMB, CKMBINDEX, TROPONINI in the last 168 hours. BNP: BNP (last 3 results) Recent Labs    01/31/20 1733  BNP 28.8    ProBNP (last 3 results) No results for input(s): PROBNP in the last 8760 hours.  CBG: No results for input(s): GLUCAP in the last 168 hours.     Signed:  03/30/20, MD [facility] 12/09/2020, 10:49 AM

## 2020-12-10 ENCOUNTER — Ambulatory Visit (HOSPITAL_COMMUNITY)
Admit: 2020-12-10 | Discharge: 2020-12-10 | Disposition: A | Discharge status: Home / Self Care | Payer: Managed Care, Other (non HMO) | Attending: Pulmonary Disease | Admitting: Pulmonary Disease

## 2020-12-10 DIAGNOSIS — U071 COVID-19: Secondary | ICD-10-CM | POA: Diagnosis present

## 2020-12-10 MED ORDER — DIPHENHYDRAMINE HCL 50 MG/ML IJ SOLN: 50.0000 mg | Freq: Once | INTRAMUSCULAR | Status: DC | PRN

## 2020-12-10 MED ORDER — SODIUM CHLORIDE 0.9 % IV SOLN
100.0000 mg | Freq: Once | INTRAVENOUS | Status: AC
  Administered 2020-12-10: 11:00:00 100 mg via INTRAVENOUS
  Filled 2020-12-10: qty 20

## 2020-12-10 MED ORDER — SODIUM CHLORIDE 0.9 % IV SOLN: INTRAVENOUS | Status: DC | PRN

## 2020-12-10 MED ORDER — EPINEPHRINE 0.3 MG/0.3ML IJ SOAJ: 0.3000 mg | Freq: Once | INTRAMUSCULAR | Status: DC | PRN

## 2020-12-10 MED ORDER — METHYLPREDNISOLONE SODIUM SUCC 125 MG IJ SOLR: 125.0000 mg | Freq: Once | INTRAMUSCULAR | Status: DC | PRN

## 2020-12-10 MED ORDER — FAMOTIDINE IN NACL 20-0.9 MG/50ML-% IV SOLN: 20.0000 mg | Freq: Once | INTRAVENOUS | Status: DC | PRN

## 2020-12-10 MED ORDER — ALBUTEROL SULFATE HFA 108 (90 BASE) MCG/ACT IN AERS: 2.0000 | INHALATION_SPRAY | Freq: Once | RESPIRATORY_TRACT | Status: DC | PRN

## 2020-12-10 NOTE — Discharge Instructions (Signed)
10 Things You Can Do to Manage Your COVID-19 Symptoms at Home If you have possible or confirmed COVID-19: 1. Stay home from work and school. And stay away from other public places. If you must go out, avoid using any kind of public transportation, ridesharing, or taxis. 2. Monitor your symptoms carefully. If your symptoms get worse, call your healthcare provider immediately. 3. Get rest and stay hydrated. 4. If you have a medical appointment, call the healthcare provider ahead of time and tell them that you have or may have COVID-19. 5. For medical emergencies, call 911 and notify the dispatch personnel that you have or may have COVID-19. 6. Cover your cough and sneezes with a tissue or use the inside of your elbow. 7. Wash your hands often with soap and water for at least 20 seconds or clean your hands with an alcohol-based hand sanitizer that contains at least 60% alcohol. 8. As much as possible, stay in a specific room and away from other people in your home. Also, you should use a separate bathroom, if available. If you need to be around other people in or outside of the home, wear a mask. 9. Avoid sharing personal items with other people in your household, like dishes, towels, and bedding. 10. Clean all surfaces that are touched often, like counters, tabletops, and doorknobs. Use household cleaning sprays or wipes according to the label instructions. cdc.gov/coronavirus 06/21/2019 This information is not intended to replace advice given to you by your health care provider. Make sure you discuss any questions you have with your health care provider. Document Revised: 11/23/2019 Document Reviewed: 11/23/2019 Elsevier Patient Education  2020 Elsevier Inc.  

## 2020-12-10 NOTE — Progress Notes (Signed)
  Diagnosis: COVID-19  Physician: DR. Delford Field  Procedure: [facility] Med: remdesivir infusion - Provided patient with remdesivir fact sheet for patients, parents and caregivers prior to infusion.  Complications: No immediate complications noted.  Discharge: Discharged home   Robb Matar 12/10/2020

## 2020-12-19 IMAGING — DX DG CHEST 1V PORT
1 series · 1 of 1 positions shown · non-contrast
Comparison: 01/31/2020

CLINICAL DATA: Short of breath, COVID

EXAM:
PORTABLE CHEST 1 VIEW

[chest ap]
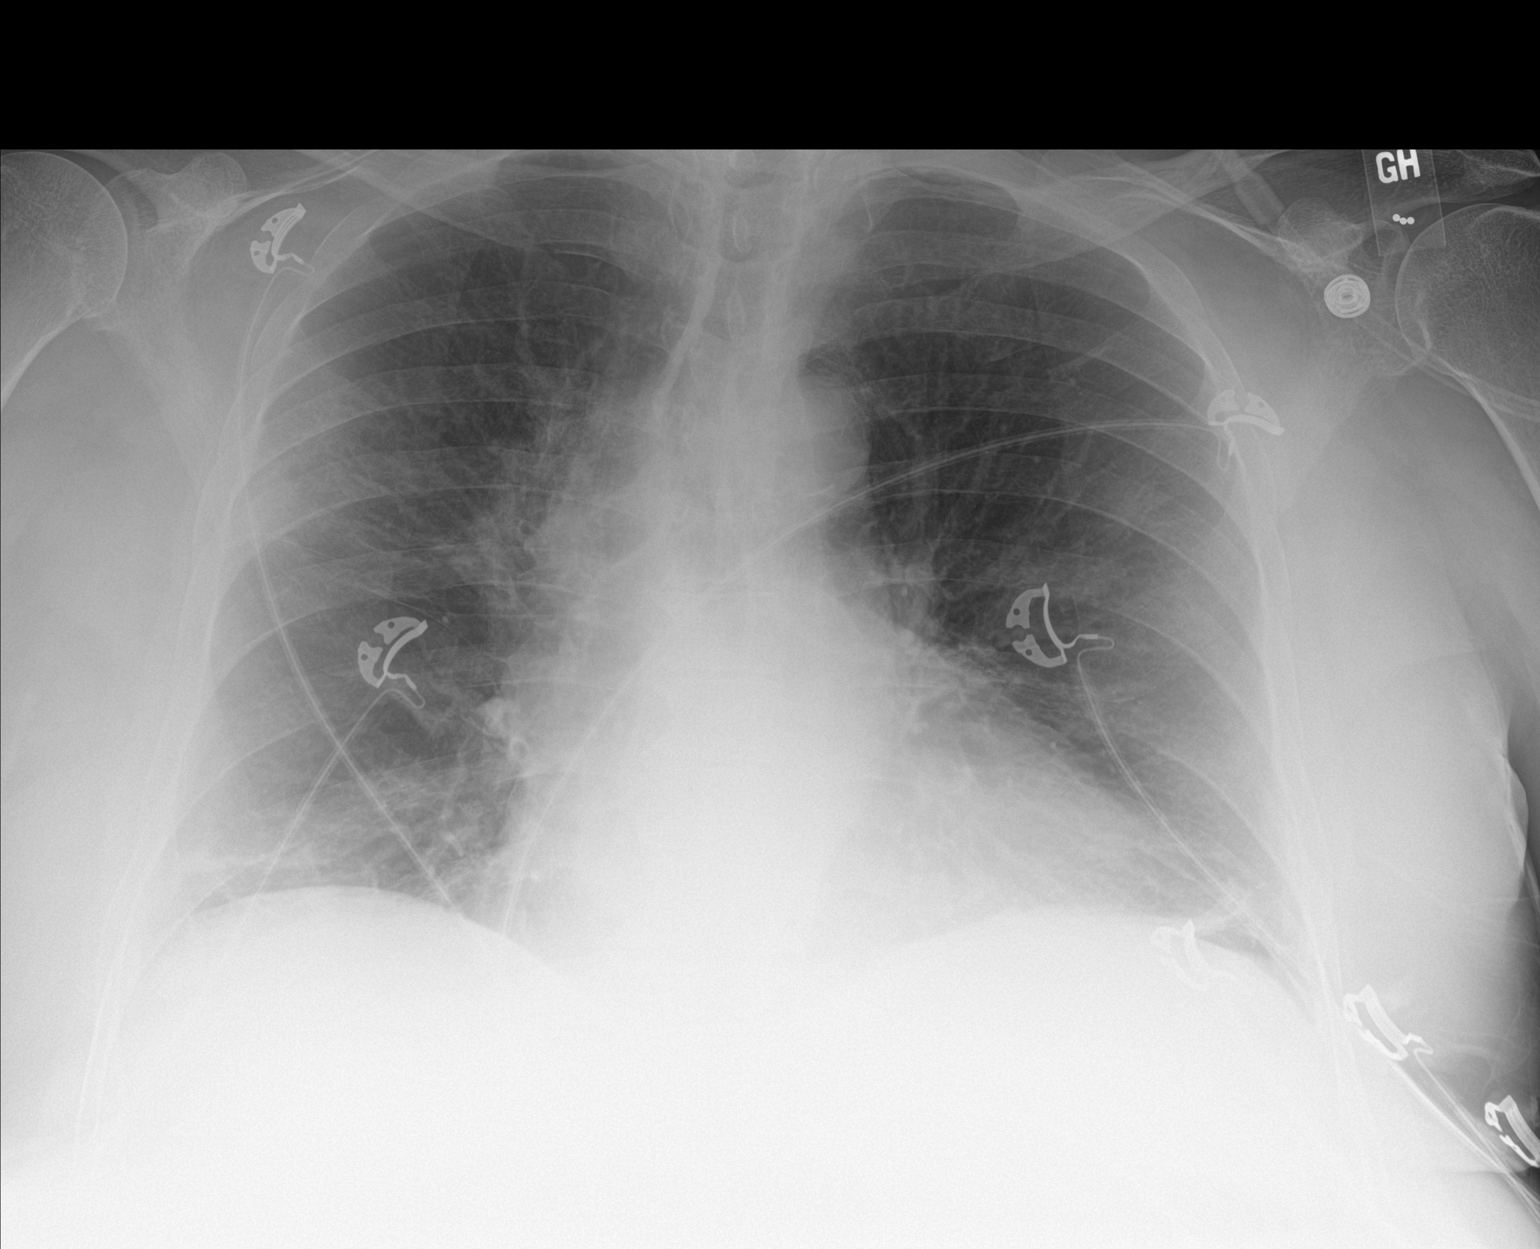

[1 of 1 positions shown; findings below may reference images not displayed]

FINDINGS: Patchy and streaky perihilar and basilar opacities. Stable
cardiomediastinal silhouette with aortic atherosclerosis. No
pneumothorax
IMPRESSION: Minimal patchy and streaky perihilar and basilar opacities, probable
areas of pneumonia.

## 2021-01-03 ENCOUNTER — Other Ambulatory Visit: Payer: Self-pay | Admitting: Internal Medicine

## 2022-10-05 ENCOUNTER — Encounter (HOSPITAL_COMMUNITY): Payer: Self-pay

## 2022-10-05 ENCOUNTER — Emergency Department (HOSPITAL_COMMUNITY)
Admission: EM | Admit: 2022-10-05 | Discharge: 2022-10-05 | Disposition: A | Discharge status: Home / Self Care | Payer: Worker's Compensation | Attending: Emergency Medicine | Admitting: Emergency Medicine

## 2022-10-05 ENCOUNTER — Other Ambulatory Visit: Payer: Self-pay

## 2022-10-05 ENCOUNTER — Emergency Department (HOSPITAL_COMMUNITY): Payer: Worker's Compensation

## 2022-10-05 DIAGNOSIS — R0603 Acute respiratory distress: Secondary | ICD-10-CM | POA: Diagnosis present

## 2022-10-05 DIAGNOSIS — Z79899 Other long term (current) drug therapy: Secondary | ICD-10-CM | POA: Insufficient documentation

## 2022-10-05 DIAGNOSIS — Z7982 Long term (current) use of aspirin: Secondary | ICD-10-CM | POA: Diagnosis not present

## 2022-10-05 DIAGNOSIS — J4521 Mild intermittent asthma with (acute) exacerbation: Secondary | ICD-10-CM

## 2022-10-05 LAB — COMPREHENSIVE METABOLIC PANEL
ALT: 15 U/L (ref 0–44)
AST: 16 U/L (ref 15–41)
Albumin: 3.7 g/dL (ref 3.5–5.0)
Alkaline Phosphatase: 66 U/L (ref 38–126)
Anion gap: 9 (ref 5–15)
BUN: 20 mg/dL (ref 8–23)
CO2: 26 mmol/L (ref 22–32)
Calcium: 8.9 mg/dL (ref 8.9–10.3)
Chloride: 104 mmol/L (ref 98–111)
Creatinine, Ser: 1.04 mg/dL (ref 0.61–1.24)
GFR, Estimated: >60 mL/min (ref >60)
Glucose, Bld: 194 mg/dL — ABNORMAL HIGH (ref 70–99)
Potassium: 3.7 mmol/L (ref 3.5–5.1)
Sodium: 139 mmol/L (ref 135–145)
Total Bilirubin: 0.5 mg/dL (ref 0.3–1.2)
Total Protein: 6.3 g/dL — ABNORMAL LOW (ref 6.5–8.1)

## 2022-10-05 LAB — CBC WITH DIFFERENTIAL/PLATELET
Abs Immature Granulocytes: 0.27 10*3/uL — ABNORMAL HIGH (ref 0.00–0.07)
Basophils Absolute: 0.1 10*3/uL (ref 0.0–0.1)
Basophils Relative: 1 %
Eosinophils Absolute: 0.2 10*3/uL (ref 0.0–0.5)
Eosinophils Relative: 2 %
HCT: 39.1 % (ref 39.0–52.0)
Hemoglobin: 12.3 g/dL — ABNORMAL LOW (ref 13.0–17.0)
Immature Granulocytes: 2 %
Lymphocytes Relative: 19 %
Lymphs Abs: 2.2 10*3/uL (ref 0.7–4.0)
MCH: 26.9 pg (ref 26.0–34.0)
MCHC: 31.5 g/dL (ref 30.0–36.0)
MCV: 85.6 fL (ref 80.0–100.0)
Monocytes Absolute: 0.8 10*3/uL (ref 0.1–1.0)
Monocytes Relative: 7 %
Neutro Abs: 7.5 10*3/uL (ref 1.7–7.7)
Neutrophils Relative %: 69 %
Platelets: 234 10*3/uL (ref 150–400)
RBC: 4.57 MIL/uL (ref 4.22–5.81)
RDW: 16.5 % — ABNORMAL HIGH (ref 11.5–15.5)
WBC: 11.1 10*3/uL — ABNORMAL HIGH (ref 4.0–10.5)
nRBC: 0 % (ref 0.0–0.2)

## 2022-10-05 LAB — I-STAT VENOUS BLOOD GAS, ED
Acid-Base Excess: 0 mmol/L (ref 0.0–2.0)
Bicarbonate: 25.1 mmol/L (ref 20.0–28.0)
Calcium, Ion: 1.18 mmol/L (ref 1.15–1.40)
HCT: 39 % (ref 39.0–52.0)
Hemoglobin: 13.3 g/dL (ref 13.0–17.0)
O2 Saturation: 96 %
Potassium: 3.8 mmol/L (ref 3.5–5.1)
Sodium: 138 mmol/L (ref 135–145)
TCO2: 26 mmol/L (ref 22–32)
pCO2, Ven: 43 mmHg — ABNORMAL LOW (ref 44–60)
pH, Ven: 7.375 (ref 7.25–7.43)
pO2, Ven: 82 mmHg — ABNORMAL HIGH (ref 32–45)

## 2022-10-05 LAB — TROPONIN I (HIGH SENSITIVITY)
Troponin I (High Sensitivity): 7 ng/L (ref <18)
Troponin I (High Sensitivity): 31 ng/L — ABNORMAL HIGH (ref <18)

## 2022-10-05 MED ORDER — ALBUTEROL SULFATE HFA 108 (90 BASE) MCG/ACT IN AERS
2.0000 | INHALATION_SPRAY | Freq: Once | RESPIRATORY_TRACT | Status: AC
  Administered 2022-10-05: 2 via RESPIRATORY_TRACT
  Filled 2022-10-05: qty 6.7

## 2022-10-05 MED ORDER — METHYLPREDNISOLONE SODIUM SUCC 125 MG IJ SOLR
125.0000 mg | Freq: Once | INTRAMUSCULAR | Status: AC
  Administered 2022-10-05: 125 mg via INTRAVENOUS
  Filled 2022-10-05: qty 2

## 2022-10-05 MED ORDER — IPRATROPIUM-ALBUTEROL 0.5-2.5 (3) MG/3ML IN SOLN
3.0000 mL | Freq: Once | RESPIRATORY_TRACT | Status: AC
  Administered 2022-10-05: 3 mL via RESPIRATORY_TRACT
  Filled 2022-10-05: qty 3

## 2022-10-05 MED ORDER — PREDNISONE 10 MG PO TABS: 40.0000 mg | ORAL_TABLET | Freq: Every day | ORAL | 0 refills | Status: AC

## 2022-10-05 NOTE — ED Notes (Signed)
Patient verbalizes understanding of discharge instructions. Opportunity for questioning and answers were provided. Armband removed by staff, pt discharged from ED. Pt taken to entrance via wheel chair and assisted into vehicle.

## 2022-10-05 NOTE — ED Triage Notes (Signed)
Bryan Fitzgerald [facility]. He inhaled a chemical which then triggered an asthma attack. Upon [facility] arrival to scene he was satting in the 70's on room air. Patient was given Nebs by [facility]- 10 mg of albuterol and 500 mcg of Atrovent before arrival to the ED. Patient denies heart disease.

## 2022-10-05 NOTE — ED Provider Notes (Signed)
[facility] EMERGENCY DEPARTMENT Provider Note   CSN: [id number] Arrival date & time: 10/05/22  1253     History  Chief Complaint  Patient presents with   Respiratory Distress    Bryan Fitzgerald is a 76 y.o. male.  76 year old male with prior medical history as detailed below presents for evaluation.  Patient reports that he was at work.  A chemical at work triggered an asthma attack per his report.  EMS reports the patient was in significant respiratory distress.  He was satting on room air in the 70s.  Patient was given multiple nebulized treatments during transport.  Patient reports minimal improvement.  Patient also reports several days of upper respiratory congestion.  Patient feels that this may also have triggered his asthma today.  He denies fever.  He denies chest pain.  Denies cardiac history.  The history is provided by the patient, medical records and the EMS personnel.       Home Medications Prior to Admission medications   Medication Sig Start Date End Date Taking? Authorizing Provider  acetaminophen (TYLENOL) 325 MG tablet Take 2 tablets (650 mg total) by mouth every 6 (six) hours as needed for mild pain (or Fever >/= 101). 02/01/20   Georgette Shell, MD  albuterol (VENTOLIN HFA) 108 (90 Base) MCG/ACT inhaler Inhale 2 puffs into the lungs every 6 (six) hours as needed for wheezing or shortness of breath. 12/08/20   Kayleen Memos, DO  amLODipine (NORVASC) 2.5 MG tablet Take 2.5 mg by mouth daily. 10/10/18   [provider]  apixaban (ELIQUIS) 2.5 MG TABS tablet Take 1 tablet (2.5 mg total) by mouth 2 (two) times daily. 12/08/20 01/07/21  Kayleen Memos, DO  aspirin 81 MG tablet Take 81 mg by mouth daily.    [provider]  doxazosin (CARDURA) 8 MG tablet Take 8 mg by mouth every evening.  01/04/19   [provider]  finasteride (PROSCAR) 5 MG tablet Take 5 mg by mouth every evening.     [provider]   GARLIC PO Take 1 capsule by mouth daily.    [provider]  ipratropium (ATROVENT) 0.02 % nebulizer solution Take 0.5 mg by nebulization every 6 (six) hours as needed for wheezing or shortness of breath. 02/13/20   [provider]  loperamide (IMODIUM) 2 MG capsule Take 1 capsule (2 mg total) by mouth as needed for diarrhea or loose stools. 12/08/20   Kayleen Memos, DO  montelukast (SINGULAIR) 10 MG tablet Take 10 mg by mouth daily.     [provider]  Multiple Vitamin (MULTIVITAMIN ADULT PO) Take 1 tablet by mouth daily.    [provider]  predniSONE (DELTASONE) 5 MG tablet Take 2 tablets (10 mg total) by mouth daily. Take 40 mg daily x2 days, then, Take 30 mg daily x2 days, then, Take 20 mg daily x2 days, then, Take 10 mg daily x2 days, then, Take 5 mg daily x2 days, then, stop. 12/08/20   Kayleen Memos, DO  SYMBICORT 160-4.5 MCG/ACT inhaler Inhale 2 puffs into the lungs daily.  10/10/18   [provider]      Allergies    Patient has no known allergies.    Review of Systems   Review of Systems  All other systems reviewed and are negative.   Physical Exam Updated Vital Signs SpO2 100%  Physical Exam Vitals and nursing note reviewed.  Constitutional:      General:  He is not in acute distress.    Appearance: Normal appearance. He is well-developed.  HENT:     Head: Normocephalic and atraumatic.  Eyes:     Conjunctiva/sclera: Conjunctivae normal.     Pupils: Pupils are equal, round, and reactive to light.  Cardiovascular:     Rate and Rhythm: Regular rhythm. Tachycardia present.  Pulmonary:     Effort: No respiratory distress.     Comments: Increased work of breathing, mild diffuse expiratory wheezes present in all lung fields Abdominal:     General: There is no distension.     Palpations: Abdomen is soft.     Tenderness: There is no abdominal tenderness.  Musculoskeletal:        General: No deformity. Normal range of  motion.     Cervical back: Normal range of motion and neck supple.  Skin:    General: Skin is warm and dry.  Neurological:     General: No focal deficit present.     Mental Status: He is alert and oriented to person, place, and time.     ED Results / Procedures / Treatments   Labs (all labs ordered are listed, but only abnormal results are displayed) Labs Reviewed  CBC WITH DIFFERENTIAL/PLATELET - Abnormal; Notable for the following components:      Result Value   WBC 11.1 (*)    Hemoglobin 12.3 (*)    RDW 16.5 (*)    Abs Immature Granulocytes 0.27 (*)    All other components within normal limits  COMPREHENSIVE METABOLIC PANEL - Abnormal; Notable for the following components:   Glucose, Bld 194 (*)    Total Protein 6.3 (*)    All other components within normal limits  RESP PANEL BY RT-PCR (FLU A&B, COVID) ARPGX2  I-STAT VENOUS BLOOD GAS, ED  TROPONIN I (HIGH SENSITIVITY)    EKG None  Radiology DG Chest Port 1 View  Result Date: 10/05/2022 CLINICAL DATA:  Shortness of breath. EXAM: PORTABLE CHEST 1 VIEW COMPARISON:  Chest radiograph dated December 06, 2020 FINDINGS: The heart size and mediastinal contours are within normal limits. Mild aortic atherosclerotic calcifications. No focal consolidation or large pleural effusion. The visualized skeletal structures are unremarkable. IMPRESSION: No active disease. Electronically Signed   By: Keane Police D.O.   On: 10/05/2022 13:48    Procedures Procedures    Medications Ordered in ED Medications  methylPREDNISolone sodium succinate (SOLU-MEDROL) 125 mg/2 mL injection 125 mg (has no administration in time range)    ED Course/ Medical Decision Making/ A&P                           Medical Decision Making Amount and/or Complexity of Data Reviewed Labs: ordered. Radiology: ordered.  Risk Prescription drug management.    Medical Screen Complete  This patient presented to the ED with complaint of respiratory distress,  asthma exacerbation.  This complaint involves an extensive number of treatment options. The initial differential diagnosis includes, but is not limited to, asthma exacerbation, pneumonia, other pulmonary pathology, metabolic abnormality, etc.  This presentation is: Acute, Chronic, Self-Limited, Previously Undiagnosed, Uncertain Prognosis, Complicated, Systemic Symptoms, and Threat to Life/Bodily Function  Patient with history of asthma presents with asthma exacerbation.  Patient was at work at work.  He feels that a chemical that he was exposed to at work may have triggered his asthma.  He also reports several days of upper respiratory congestion.  This is also a suspected trigger.  EMS noted significant respiratory distress.  Initial saturations were in the 70s on room air.  With breathing treatments the patient is improved.  Patient was placed on BiPAP on arrival to the ED.  He is significantly more comfortable on BiPAP.  At 1430 the patient feels significant improved.  He remains on BiPAP.  He is mildly tachycardic but not hypoxic.  Patient offered admission.  Patient declines at this time.  He would prefer to be given more time to improve here in the ED.  He request reevaluation at approximately 3:30.  If he has continued symptoms at that time he would be okay with being admitted.  Care of patient and pending reevaluation signed out to oncoming EDP.   Additional history obtained:  Additional history obtained from EMS External records from outside sources obtained and reviewed including prior ED visits and prior Inpatient records.    Lab Tests:  I ordered and personally interpreted labs.  The pertinent results include: CBC, CMP, troponin, venous blood gas, covid, flu   Imaging Studies ordered:  I ordered imaging studies including chest xr  I independently visualized and interpreted obtained imaging which showed NAD I agree with the radiologist interpretation.   Cardiac  Monitoring:  The patient was maintained on a cardiac monitor.  I personally viewed and interpreted the cardiac monitor which showed an underlying rhythm of: sinus tach   Medicines ordered:  I ordered medication including duoneb, solumedrol  for bronchospasm  Reevaluation of the patient after these medicines showed that the patient: improved   Problem List / ED Course:  Asthma exacerbation   Reevaluation:  After the interventions noted above, I reevaluated the patient and found that they have: improved  Disposition:  After consideration of the diagnostic results and the patients response to treatment, I feel that the patent would benefit from completion of ED evaluation/revaluation.   CRITICAL CARE Performed by: Valarie Merino   Total critical care time: 30 minutes  Critical care time was exclusive of separately billable procedures and treating other patients.  Critical care was necessary to treat or prevent imminent or life-threatening deterioration.  Critical care was time spent personally by me on the following activities: development of treatment plan with patient and/or surrogate as well as nursing, discussions with consultants, evaluation of patient's response to treatment, examination of patient, obtaining history from patient or surrogate, ordering and performing treatments and interventions, ordering and review of laboratory studies, ordering and review of radiographic studies, pulse oximetry and re-evaluation of patient's condition.          Final Clinical Impression(s) / ED Diagnoses Final diagnoses:  None    Rx / DC Orders ED Discharge Orders     None         Valarie Merino, MD 10/05/22 1430

## 2022-10-05 NOTE — ED Provider Notes (Signed)
Patient presents for respiratory distress.  He has a history of asthma and was noted to have wheezing on arrival.  He has had URI symptoms for the past several days.  He also works in a [employer] and had a noxious exposure at time of symptom worsening.  He has been treated with breathing treatments, steroids, and is on BiPAP at this time.  He is highly motivated to go home.  Trial off of BiPAP and discharge if symptoms resolve. Physical Exam  BP 138/80   Pulse (!) 114   Resp 18   Ht 5\' 2"  (1.575 m)   Wt 99 kg   SpO2 96%   BMI 39.92 kg/m   Physical Exam Vitals and nursing note reviewed.  Constitutional:      General: He is not in acute distress.    Appearance: Normal appearance. He is well-developed. He is not ill-appearing, toxic-appearing or diaphoretic.  HENT:     Head: Normocephalic and atraumatic.     Right Ear: External ear normal.     Left Ear: External ear normal.     Nose: Nose normal.     Mouth/Throat:     Mouth: Mucous membranes are moist.     Pharynx: Oropharynx is clear.  Eyes:     Extraocular Movements: Extraocular movements intact.     Conjunctiva/sclera: Conjunctivae normal.  Cardiovascular:     Rate and Rhythm: Normal rate and regular rhythm.     Heart sounds: No murmur heard. Pulmonary:     Effort: Pulmonary effort is normal. No respiratory distress.     Breath sounds: Normal breath sounds. No wheezing, rhonchi or rales.  Chest:     Chest wall: No tenderness.  Abdominal:     General: There is no distension.     Palpations: Abdomen is soft.     Tenderness: There is no abdominal tenderness.  Musculoskeletal:        General: No swelling. Normal range of motion.     Cervical back: Normal range of motion and neck supple.     Right lower leg: No edema.     Left lower leg: No edema.  Skin:    General: Skin is warm and dry.     Capillary Refill: Capillary refill takes less than 2 seconds.     Coloration: Skin is not jaundiced or pale.  Neurological:      General: No focal deficit present.     Mental Status: He is alert and oriented to person, place, and time.     Cranial Nerves: No cranial nerve deficit.     Sensory: No sensory deficit.     Motor: No weakness.     Coordination: Coordination normal.  Psychiatric:        Mood and Affect: Mood normal.        Behavior: Behavior normal.        Thought Content: Thought content normal.        Judgment: Judgment normal.    Procedures  Procedures  ED Course / MDM    Medical Decision Making Amount and/or Complexity of Data Reviewed Labs: ordered. Radiology: ordered.  Risk Prescription drug management.   On assessment, patient is resting comfortably.  He states that he has continued to have improved symptoms.  He was taken off of BiPAP.  At this time, lungs are clear to auscultation.  Patient is able to speak in complete sentences with no visible increased work of breathing.  His SPO2 is normal on room air.  His heart rate is elevated I suspect this is, at least in part, secondary to recent albuterol.  We will continue to observe off of BiPAP.  Following 1 hour being off of BiPAP, on room air, patient had continued resolution of symptoms.  Lungs remain clear to auscultation.  His heart rate improved and was now in the range of 100.  He was prescribed prednisone for the next 4 days.  Patient will avoid work until hazardous chemicals are completely cleaned.  He was discharged in stable condition.       Gloris Manchester, MD 10/05/22 [phone]

## 2022-10-05 NOTE — Discharge Instructions (Signed)
Continue to use albuterol at home as needed.  There was a prescription sent to your pharmacy for prednisone, to be taken for the next 4 days.  Take this as prescribed.  Return to the emergency department at any time for any return of shortness of breath.

## 2022-11-11 ENCOUNTER — Other Ambulatory Visit: Payer: Self-pay | Admitting: Urology

## 2022-11-11 DIAGNOSIS — R972 Elevated prostate specific antigen [PSA]: Secondary | ICD-10-CM

## 2023-02-05 ENCOUNTER — Ambulatory Visit
Admission: RE | Admit: 2023-02-05 | Discharge: 2023-02-05 | Disposition: A | Discharge status: Home / Self Care | Payer: Managed Care, Other (non HMO) | Source: Ambulatory Visit | Attending: Urology | Admitting: Urology

## 2023-02-05 DIAGNOSIS — R972 Elevated prostate specific antigen [PSA]: Secondary | ICD-10-CM

## 2023-02-05 MED ORDER — GADOPICLENOL 0.5 MMOL/ML IV SOLN
10.0000 mL | Freq: Once | INTRAVENOUS | Status: AC | PRN
  Administered 2023-02-05: 10 mL via INTRAVENOUS

## 2024-05-27 ENCOUNTER — Emergency Department (HOSPITAL_BASED_OUTPATIENT_CLINIC_OR_DEPARTMENT_OTHER): Admission: EM | Admit: 2024-05-27 | Discharge: 2024-05-27 | Disposition: A | Discharge status: Home / Self Care

## 2024-05-27 ENCOUNTER — Emergency Department (HOSPITAL_BASED_OUTPATIENT_CLINIC_OR_DEPARTMENT_OTHER)

## 2024-05-27 ENCOUNTER — Encounter (HOSPITAL_BASED_OUTPATIENT_CLINIC_OR_DEPARTMENT_OTHER): Payer: Self-pay | Admitting: Emergency Medicine

## 2024-05-27 ENCOUNTER — Other Ambulatory Visit: Payer: Self-pay

## 2024-05-27 DIAGNOSIS — Z7951 Long term (current) use of inhaled steroids: Secondary | ICD-10-CM | POA: Diagnosis not present

## 2024-05-27 DIAGNOSIS — I10 Essential (primary) hypertension: Secondary | ICD-10-CM | POA: Insufficient documentation

## 2024-05-27 DIAGNOSIS — Z79899 Other long term (current) drug therapy: Secondary | ICD-10-CM | POA: Diagnosis not present

## 2024-05-27 DIAGNOSIS — J4489 Other specified chronic obstructive pulmonary disease: Secondary | ICD-10-CM | POA: Diagnosis not present

## 2024-05-27 DIAGNOSIS — N320 Bladder-neck obstruction: Secondary | ICD-10-CM | POA: Insufficient documentation

## 2024-05-27 DIAGNOSIS — Z7982 Long term (current) use of aspirin: Secondary | ICD-10-CM | POA: Insufficient documentation

## 2024-05-27 DIAGNOSIS — R319 Hematuria, unspecified: Secondary | ICD-10-CM

## 2024-05-27 DIAGNOSIS — R339 Retention of urine, unspecified: Secondary | ICD-10-CM

## 2024-05-27 LAB — CBC
HCT: 38.5 % — ABNORMAL LOW (ref 39.0–52.0)
Hemoglobin: 12.1 g/dL — ABNORMAL LOW (ref 13.0–17.0)
MCH: 26.4 pg (ref 26.0–34.0)
MCHC: 31.4 g/dL (ref 30.0–36.0)
MCV: 83.9 fL (ref 80.0–100.0)
Platelets: 243 10*3/uL (ref 150–400)
RBC: 4.59 MIL/uL (ref 4.22–5.81)
RDW: 15 % (ref 11.5–15.5)
WBC: 8 10*3/uL (ref 4.0–10.5)
nRBC: 0 % (ref 0.0–0.2)

## 2024-05-27 LAB — BASIC METABOLIC PANEL WITH GFR
Anion gap: 12 (ref 5–15)
BUN: 25 mg/dL — ABNORMAL HIGH (ref 8–23)
CO2: 23 mmol/L (ref 22–32)
Calcium: 8.9 mg/dL (ref 8.9–10.3)
Chloride: 102 mmol/L (ref 98–111)
Creatinine, Ser: 1.27 mg/dL — ABNORMAL HIGH (ref 0.61–1.24)
GFR, Estimated: 59 mL/min — ABNORMAL LOW (ref >60)
Glucose, Bld: 128 mg/dL — ABNORMAL HIGH (ref 70–99)
Potassium: 4.3 mmol/L (ref 3.5–5.1)
Sodium: 137 mmol/L (ref 135–145)

## 2024-05-27 LAB — URINALYSIS, MICROSCOPIC (REFLEX): RBC / HPF: >50 RBC/hpf (ref 0–5)

## 2024-05-27 LAB — URINALYSIS, ROUTINE W REFLEX MICROSCOPIC

## 2024-05-27 MED ORDER — CEFADROXIL 500 MG PO CAPS: 500.0000 mg | ORAL_CAPSULE | Freq: Two times a day (BID) | ORAL | 0 refills | Status: AC

## 2024-05-27 MED ORDER — CEPHALEXIN 250 MG PO CAPS
500.0000 mg | ORAL_CAPSULE | Freq: Once | ORAL | Status: AC
  Administered 2024-05-27: 500 mg via ORAL
  Filled 2024-05-27: qty 2

## 2024-05-27 NOTE — Discharge Instructions (Addendum)
 As discussed, will put you on antibiotics concern for possible underlying infection.  Keep Foley catheter in place and to follow-up with urology; I will place referral as well as give you their information.  Return if the catheter is not draining, you develop fever pain gross blood in urine that does not get better.  Otherwise, recommend follow-up with urology in the outpatient setting.

## 2024-05-27 NOTE — ED Notes (Signed)
 IV removed.

## 2024-05-27 NOTE — ED Provider Notes (Signed)
 [place] AT [facility] Provider Note   CSN: [id number] Arrival date & time: 05/27/24  1204     History  Chief Complaint  Patient presents with   Hematuria    Bryan Fitzgerald is a 78 y.o. male.   Hematuria   78 year old male presents emergency department with complaints of blood in urine.  States that he noticed this morning after taking a bath.  Denies any abdominal pain or feelings of urinary retention, anticoagulation use.  Does state that he uses aspirin .  States that he was having a very brief moment of left-sided flank pain earlier this morning prior to noticing blood in his urine.  Took a Tylenol  and has not noticed symptoms since then.  Denies any history of bladder cancer, kidney stone.  Denies any burning when urinating, fever, urinating more frequently.  Presents emergency department for further assessment/evaluation.  Past medical history significant for hypertension, asthma, seasonal allergies, BPH, COPD, asthma, AKI  Home Medications Prior to Admission medications   Medication Sig Start Date End Date Taking? Authorizing Provider  albuterol  (VENTOLIN  HFA) 108 (90 Base) MCG/ACT inhaler Inhale 2 puffs into the lungs every 6 (six) hours as needed for wheezing or shortness of breath. 12/08/20   Bary Boss, DO  amLODipine  (NORVASC ) 2.5 MG tablet Take 2.5 mg by mouth daily. 10/10/18   [provider]  aspirin  81 MG tablet Take 81 mg by mouth daily.    [provider]  doxazosin  (CARDURA ) 8 MG tablet Take 8 mg by mouth every evening.  01/04/19   [provider]  finasteride  (PROSCAR ) 5 MG tablet Take 5 mg by mouth every evening.     [provider]  ipratropium (ATROVENT ) 0.02 % nebulizer solution Take 0.5 mg by nebulization every 6 (six) hours as needed for wheezing or shortness of breath. 02/13/20   [provider]  LINZESS 145 MCG CAPS capsule Take 145 mcg by mouth daily as needed for constipation.  09/12/22   [provider]  losartan -hydrochlorothiazide (HYZAAR) 100-12.5 MG tablet Take 1 tablet by mouth daily. 10/01/22   [provider]  Multiple Vitamin (MULTIVITAMIN ADULT PO) Take 1 tablet by mouth daily.    [provider]  SYMBICORT  160-4.5 MCG/ACT inhaler Inhale 2 puffs into the lungs daily.  10/10/18   [provider]      Allergies    Patient has no known allergies.    Review of Systems   Review of Systems  Genitourinary:  Positive for hematuria.  All other systems reviewed and are negative.   Physical Exam Updated Vital Signs BP 139/70 (BP Location: Right Arm)   Pulse (!) 103   Temp 98.2 F (36.8 C) (Oral)   Resp 20   Ht 5\' 2"  (1.575 m)   Wt 93 kg   SpO2 98%   BMI 37.49 kg/m  Physical Exam Vitals and nursing note reviewed.  Constitutional:      General: He is not in acute distress.    Appearance: He is well-developed.  HENT:     Head: Normocephalic and atraumatic.  Eyes:     Conjunctiva/sclera: Conjunctivae normal.  Cardiovascular:     Rate and Rhythm: Normal rate and regular rhythm.     Heart sounds: No murmur heard. Pulmonary:     Effort: Pulmonary effort is normal. No respiratory distress.     Breath sounds: Normal breath sounds.  Abdominal:     Palpations: Abdomen is soft.  Tenderness: There is no abdominal tenderness. There is no right CVA tenderness, left CVA tenderness or guarding.  Musculoskeletal:        General: No swelling.     Cervical back: Neck supple.  Skin:    General: Skin is warm and dry.     Capillary Refill: Capillary refill takes less than 2 seconds.  Neurological:     Mental Status: He is alert.  Psychiatric:        Mood and Affect: Mood normal.    ED Results / Procedures / Treatments   Labs (all labs ordered are listed, but only abnormal results are displayed) Labs Reviewed  CBC - Abnormal; Notable for the following components:      Result Value   Hemoglobin 12.1 (*)    HCT 38.5  (*)    All other components within normal limits  URINALYSIS, ROUTINE W REFLEX MICROSCOPIC  BASIC METABOLIC PANEL WITH GFR    EKG None  Radiology No results found.  Procedures Procedures    Medications Ordered in ED Medications - No data to display  ED Course/ Medical Decision Making/ A&P                                 Medical Decision Making Amount and/or Complexity of Data Reviewed Labs: ordered. Radiology: ordered.  Risk Prescription drug management.   This patient presents to the ED for concern of hematuria, this involves an extensive number of treatment options, and is a complaint that carries with it a high risk of complications and morbidity.  The differential diagnosis includes UTI, nephrolithiasis, malignancy, other,   Co morbidities that complicate the patient evaluation  See HPI   Additional history obtained:  Additional history obtained from EMR External records from outside source obtained and reviewed including hospital records   Lab Tests:  I Ordered, and personally interpreted labs.  The pertinent results include: No leukocytosis.  Anemia with hemoglobin of 12.1.  Platelets within range.  No electrolyte abnormalities.  Patient with near baseline renal function creatinine 1.27, BUN of 25 and patient's creatinine around 1.2-1.5 per prior labs available.  UA grossly red and turbid with many bacteria, RBCs, WBCs present.  Urine culture pending.   Imaging Studies ordered:  I ordered imaging studies including CT renal stone I independently visualized and interpreted imaging which showed marked urinary bladder distention volume level around 1100 cc.  Enlarged prostate as mass effective on bladder.  Left-sided hydronephrosis and hydroureter.  Colonic diverticulosis.  Small pericardial effusion.  Aortic atherosclerosis. I agree with the radiologist interpretation  Cardiac Monitoring: / EKG:  The patient was maintained on a cardiac monitor.  I  personally viewed and interpreted the cardiac monitored which showed an underlying rhythm of: Sinus rhythm   Consultations Obtained:  I requested consultation with attending Dr. Linder Revere who significantly changed plan going forward   Problem List / ED Course / Critical interventions / Medication management  Hematuria, bladder outlet obstruction, urinary retention I ordered medication including Keflex  Reevaluation of the patient after these medicines showed that the patient stayed the same I have reviewed the patients home medicines and have made adjustments as needed   Social Determinants of Health:  Denies tobacco, illicit drug use.   Test / Admission - Considered:  Hematuria, bladder outlet obstruction, urinary retention Vitals signs within normal range and stable throughout visit. Laboratory/imaging studies significant for: See above 78 year old male presents emergency department with  complaints of blood in urine.  States that he noticed this morning after taking a bath.  Denies any abdominal pain or feelings of urinary retention, anticoagulation use.  Does state that he uses aspirin .  States that he was having a very brief moment of left-sided flank pain earlier this morning prior to noticing blood in his urine.  Took a Tylenol  and has not noticed symptoms since then.  Denies any history of bladder cancer, kidney stone.  Denies any burning when urinating, fever, urinating more frequently.  Presents emergency department for further assessment/evaluation. On exam, no appreciable abdominal tenderness or obvious CVA tenderness.  Workup today, UA grossly red and turbid with RBCs, RBCs and bacteria present although with contaminated sample.  Renal function at baseline per prior labs performed.  CT renal stone study showed enlarged prostate, left-sided hydronephrosis, distended urinary bladder.  Patient reportedly urinated afterwards but with bladder scan showing greater than 900 cc.  Foley  catheter was inserted with greater than 500 cc of urinary output and still draining.  Patient's hematuria could be secondary to urine infection.  Urinary tension seems to be present to bladder outlet obstruction via prostate.  Appearance pink without obvious clots or sediment present.  Will culture urine empirically place patient on antibiotics.  Will recommend close follow-up with urology in the outpatient setting for reassessment.  Treatment plan discussed with patient and he acknowledged understanding was agreeable to said plan.  Patient overall well-appearing, afebrile in no acute distress. Worrisome signs and symptoms were discussed with the patient, and the patient acknowledged understanding to return to the ED if noticed. Patient was stable upon discharge.          Final Clinical Impression(s) / ED Diagnoses Final diagnoses:  None    Rx / DC Orders ED Discharge Orders     None         Seymour Butter, [place] 05/27/24 1616    Rolinda Climes, DO 05/28/24 1149

## 2024-05-27 NOTE — ED Notes (Signed)
 Pt alert and oriented X 4 at the time of discharge. RR even and unlabored. No acute distress noted. Pt verbalized understanding of discharge instructions as discussed. Pt ambulatory to lobby at time of discharge.

## 2024-05-27 NOTE — ED Triage Notes (Signed)
 Pt c/o hematuria that started today; has some lower back pain; denies dysuria

## 2024-05-28 LAB — URINE CULTURE: Culture: NO GROWTH
